# Patient Record
Sex: Male | Born: 1965 | Race: White | Hispanic: No | Marital: Married | Smoking: Never smoker
Health system: Southern US, Community
[De-identification: ages and names within clinical notes are randomized; demographics above are authoritative.]

## PROBLEM LIST (undated history)

## (undated) DIAGNOSIS — M1990 Unspecified osteoarthritis, unspecified site: Secondary | ICD-10-CM

## (undated) DIAGNOSIS — T4145XA Adverse effect of unspecified anesthetic, initial encounter: Secondary | ICD-10-CM

## (undated) DIAGNOSIS — Z9889 Other specified postprocedural states: Secondary | ICD-10-CM

## (undated) DIAGNOSIS — E7800 Pure hypercholesterolemia, unspecified: Secondary | ICD-10-CM

## (undated) DIAGNOSIS — T8859XA Other complications of anesthesia, initial encounter: Secondary | ICD-10-CM

## (undated) DIAGNOSIS — R112 Nausea with vomiting, unspecified: Secondary | ICD-10-CM

## (undated) HISTORY — DX: Pure hypercholesterolemia, unspecified: E78.00

---

## 1999-05-26 DIAGNOSIS — R112 Nausea with vomiting, unspecified: Secondary | ICD-10-CM

## 1999-05-26 HISTORY — PX: CERVICAL FUSION: SHX112

## 1999-05-26 HISTORY — DX: Nausea with vomiting, unspecified: R11.2

## 2000-09-02 ENCOUNTER — Encounter: Payer: Self-pay | Admitting: Neurosurgery

## 2000-09-02 ENCOUNTER — Inpatient Hospital Stay (HOSPITAL_COMMUNITY): Admission: RE | Admit: 2000-09-02 | Discharge: 2000-09-03 | Payer: Self-pay | Admitting: Neurosurgery

## 2000-09-29 ENCOUNTER — Encounter: Payer: Self-pay | Admitting: Neurosurgery

## 2000-09-29 ENCOUNTER — Encounter: Admission: RE | Admit: 2000-09-29 | Discharge: 2000-09-29 | Payer: Self-pay | Admitting: Neurosurgery

## 2005-08-19 LAB — PROTIME-INR

## 2005-09-25 LAB — PROTIME-INR

## 2008-02-09 LAB — PROTIME-INR

## 2008-10-26 ENCOUNTER — Ambulatory Visit: Payer: Self-pay | Admitting: Oncology

## 2008-11-21 LAB — CBC WITH DIFFERENTIAL/PLATELET
BASO%: 0.3 % (ref 0.0–2.0)
Basophils Absolute: 0 10*3/uL (ref 0.0–0.1)
EOS%: 1.3 % (ref 0.0–7.0)
Eosinophils Absolute: 0.1 10*3/uL (ref 0.0–0.5)
HCT: 43.1 % (ref 38.4–49.9)
HGB: 15.1 g/dL (ref 13.0–17.1)
LYMPH%: 27.3 % (ref 14.0–49.0)
MCH: 30.6 pg (ref 27.2–33.4)
MCHC: 35.2 g/dL (ref 32.0–36.0)
MCV: 87 fL (ref 79.3–98.0)
MONO#: 0.5 10*3/uL (ref 0.1–0.9)
MONO%: 8.3 % (ref 0.0–14.0)
NEUT#: 3.7 10*3/uL (ref 1.5–6.5)
NEUT%: 62.8 % (ref 39.0–75.0)
Platelets: 176 10*3/uL (ref 140–400)
RBC: 4.95 10*6/uL (ref 4.20–5.82)
RDW: 12.3 % (ref 11.0–14.6)
WBC: 5.9 10*3/uL (ref 4.0–10.3)
lymph#: 1.6 10*3/uL (ref 0.9–3.3)

## 2008-11-21 LAB — MORPHOLOGY
PLT EST: ADEQUATE
RBC Comments: NORMAL

## 2008-11-21 LAB — CHCC SMEAR

## 2008-11-21 LAB — PROTIME-INR

## 2008-11-21 LAB — D-DIMER, QUANTITATIVE: D-Dimer, Quant: <0.22 ug/mL-FEU (ref 0.00–0.48)

## 2010-10-10 NOTE — H&P (Signed)
[place]  Patient:    Jason Scott, Jason Scott                        MRN: [id number] Adm. Date:  09/02/00 Attending:  Izell [place]. Elesa Hacker, M.D.                         History and Physical  HISTORY OF PRESENT ILLNESS:  [name], Jason Scott. is a 45 year old male who presented with a chief complaint of pain in his neck associated with radiating pain towards his left scapular region and then occasionally on down his arm to and sometimes below the shoulder. He has difficulty with pain in these areas for several years off and on; but the last few months or so, he has noticed a significant worsening of his symptoms to the point that it is inferring with him every day, both at work and with his hobbies. He enjoys golf and fishing.  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 He has been having some difficulty with this because of weakness and pain in his arm.  He has tried ibuprofen, and this has helped to some extent. He has been seen by a chiropractor in [facility] but not recently.  He notes no involvement of bowel and bladder, and he says his legs are unaffected.  PAST MEDICAL HISTORY:  No serious medical problems.  FAMILY HISTORY:  His mother is living at age 39 but has sinus difficulty. His father is 63 and has high blood pressure and history of spurs in his neck that did require surgery.  PAST SURGICAL HISTORY:  He has had none.  CURRENT MEDICATIONS:  At the present time, he has been taking the ibuprofen as noted.  ALLERGIES:  He thinks he is allergic to PENICILLIN and CODEINE.  SOCIAL HISTORY:  He does not smoke. He drinks about two beers a day.  REVIEW OF SYSTEMS:  Pertinent as mentioned in the history of present illness.  PHYSICAL EXAMINATION:  GENERAL:  He is alert and cooperative.  VITAL SIGNS:  He weighs 174 pounds, is 6 feet 1 inch tall, with  a blood pressure of 126/86, and a pulse of 74; he is afebrile.  HEENT:  Within normal limits.  NECK:  Limited range of motion particularly when he turns to the left side. He has no adenopathy or mass lesions in his neck or supraclavicular region, and he has no bruits.  CHEST:  Clear.  CARDIAC:  No murmurs or enlargement.  ABDOMEN:  Soft and nontender with no palpable masses.  NEUROLOGICAL:  Sensorium and cranial nerves intact. His pain can be increased with rotation of the head and neck to the left particularly with hyperextension. He has good strength, except for reproducible triceps weakness on the left which is minimal but definite. Deep tendon reflexes show a diminished left biceps reflex on repeated testing. Gait and coordination are normal. Romberg test is negative. Sensory examination shows some slight amount of hypoesthesia over the thumb on the left.  LABORATORY DATA:  This man had his cervical MRI study done, and this demonstrated a disk herniation with spondylosis at C5, C6 on the left side.  PLAN:  He is admitted for an anterior cervical diskectomy and fusion at C5, C6.  Goals and risks of the operation were  discussed with him in detail. He knows that the risks include bleeding, infection, damage to structures in the neck including carotid artery, trachea, or esophagus. He understands that there can be damage to spinal cord and nerve root resulting in weakness, paralysis, and loss of sensation. DD:  09/01/00 TD:  09/01/00 Job: [id number]

## 2010-10-10 NOTE — Op Note (Signed)
[place]  Patient:    Jason Scott, Jason Scott                         MRN: [id number] Proc. Date: 09/02/00 Adm. Date:  [id number] Attending:  Jackelyn Knife                           Operative Report  PREOPERATIVE DIAGNOSIS:  Herniated cervical disk at C5-C6.  POSTOPERATIVE DIAGNOSIS:  Herniated cervical disk at C5-C6.  OPERATION PERFORMED:  C5-C6 anterior cervical diskectomy and fusion with allograft and application of a 26 mm Blackstone anterior cervical plate.  SURGEON:  Izell [employer]. Elesa Hacker, M.D.  ASSISTANT:  Clydene Fake, M.D.  ANESTHESIA:  INDICATIONS FOR PROCEDURE:  The patient is a generally healthy 45 year old male who presented with increasing neck and left upper extremity pain, in association with developing biceps weakness on the left side and numbness extending toward the thumb and index finger on that side.  An outside MRI demonstrated C5-C6 soft disk herniation.  Since he did not respond to conservative measures, he was admitted for the procedure described below.  DESCRIPTION OF PROCEDURE:  Under general endotracheal anesthesia, the patient was positioned on the operating table supine with his head in halter traction. The anterior portion of the head and neck was prepped and the patient draped in the usual manner.  A preliminary cross table lateral film was made with a metallic marker in place and the incision was then made based upon that localizing x-ray.  The incision was on the left side of the neck extending from the midline to the anterior border of the sternocleidomastoid. Dissection was carried down through subcutaneous tissues and platysmal layers to the strap muscles.  Blunt dissection was utilized to approach the anterior vertebral border.  A second cross table lateral film was taken to assure the proper operative level.  The C5-C6 interspace was identified and self-retaining retractors were put into place after the  longus colli muscles had been elevated from the anterior vertebral border.  The annulus was incised.  Caspar retractor posts were put into place and utilized for retraction interspace.  The operating microscope was brought into place at this point.  Using curets and pituitary rongeurs, the soft disk material in the interspace was removed. After this, a high speed drill was brought in for completion of removal of osteophytes and preparation of the end plate areas for receiving the bone graft.  Once all soft disk material had been removed, the posterior longitudinal ligament was sectioned in its entirety.  An 8 mm iliac crest allograft was shaped to fit in the interspace and impacted into place.  Halter traction was removed and Caspar retraction, likewise, was taken off.  Care was taken to ensure the fit of the bone graft.  The wound was copiously irrigated and hemostasis was obtained.  A 26 mm Blackstone system, anterior cervical plate was then put in place with four 14 mm screws.  An additional cross table lateral was taken intraoperatively to ensure proper positioning of the hardware and bone graft.  The wound was then further irrigated and closed in layers.  A sterile dressing was applied and the patient was taken to the recovery room in good condition. DD:  09/02/00 TD:  09/02/00 Job: [id number]

## 2013-04-13 ENCOUNTER — Telehealth: Payer: Self-pay | Admitting: Internal Medicine

## 2013-04-13 NOTE — Telephone Encounter (Signed)
lvom for pt to return call in re to referral.  °

## 2013-04-14 ENCOUNTER — Telehealth: Payer: Self-pay | Admitting: Internal Medicine

## 2013-04-14 NOTE — Telephone Encounter (Signed)
S/w pt and gve np appt 12/11 @ 10:30 w/Dr. Rosie Fate Referring Dr. Lois Huxley Dx- Coagulopathy Welcome packet mailed.

## 2013-04-14 NOTE — Telephone Encounter (Signed)
C/D 04/14/13 for appt. 05/04/13

## 2013-05-04 ENCOUNTER — Ambulatory Visit: Payer: BC Managed Care – PPO

## 2013-05-04 ENCOUNTER — Encounter: Payer: Self-pay | Admitting: Internal Medicine

## 2013-05-04 ENCOUNTER — Telehealth: Payer: Self-pay | Admitting: Internal Medicine

## 2013-05-04 ENCOUNTER — Other Ambulatory Visit: Payer: Commercial Managed Care - PPO

## 2013-05-04 ENCOUNTER — Ambulatory Visit (HOSPITAL_BASED_OUTPATIENT_CLINIC_OR_DEPARTMENT_OTHER): Payer: BC Managed Care – PPO | Admitting: Internal Medicine

## 2013-05-04 ENCOUNTER — Telehealth: Payer: Self-pay | Admitting: *Deleted

## 2013-05-04 ENCOUNTER — Ambulatory Visit (HOSPITAL_COMMUNITY)
Admission: RE | Admit: 2013-05-04 | Discharge: 2013-05-04 | Disposition: A | Discharge status: Home / Self Care | Payer: BC Managed Care – PPO | Source: Ambulatory Visit | Attending: Internal Medicine | Admitting: Internal Medicine

## 2013-05-04 ENCOUNTER — Ambulatory Visit (HOSPITAL_BASED_OUTPATIENT_CLINIC_OR_DEPARTMENT_OTHER): Payer: BC Managed Care – PPO

## 2013-05-04 VITALS — BP 142/82 | HR 68 | Temp 97.8°F | Resp 18 | Ht 73.0 in | Wt 210.2 lb

## 2013-05-04 DIAGNOSIS — D6859 Other primary thrombophilia: Secondary | ICD-10-CM

## 2013-05-04 DIAGNOSIS — E7800 Pure hypercholesterolemia, unspecified: Secondary | ICD-10-CM

## 2013-05-04 DIAGNOSIS — M7989 Other specified soft tissue disorders: Secondary | ICD-10-CM

## 2013-05-04 DIAGNOSIS — D6852 Prothrombin gene mutation: Secondary | ICD-10-CM

## 2013-05-04 DIAGNOSIS — M79609 Pain in unspecified limb: Secondary | ICD-10-CM | POA: Insufficient documentation

## 2013-05-04 NOTE — Progress Notes (Signed)
*  Preliminary Results* Bilateral lower extremity venous duplex completed. Bilateral lower extremities are negative for deep vein thrombosis. There is no evidence of Baker's cyst bilaterally.  Attempted to call preliminary results to [phone], however there was no answer. The patient will be discharged and can be reached by phone if necessary.  05/04/2013  Gertie Fey, RVT, RDCS, RDMS

## 2013-05-04 NOTE — Progress Notes (Signed)
Checked in new patient and has no financial issues and has not been to [place]. He has appt card. He is JR. So make sure all says JR--or will get mixed up with his dad

## 2013-05-04 NOTE — Telephone Encounter (Signed)
gv and printed appt sched and avs for pt for JUNE sent pt to lab....  and dopper added

## 2013-05-04 NOTE — Telephone Encounter (Signed)
Called pt at work and left message re:  Doppler stuly results -  Negative for DVT.

## 2013-05-05 LAB — HYPERCOAGULABLE PANEL, COMPREHENSIVE
AntiThromb III Func: 115 % (ref 76–126)
Anticardiolipin IgG: 16 GPL U/mL (ref <23)
Beta-2 Glyco I IgG: 2 G Units (ref <20)
DRVVT: 34.9 secs (ref <42.9)
Lupus Anticoagulant: NOT DETECTED
Protein C Activity: 200 % — ABNORMAL HIGH (ref 75–133)
Protein S Activity: 137 % — ABNORMAL HIGH (ref 69–129)
Protein S Total: 126 % (ref 60–150)

## 2013-05-06 ENCOUNTER — Encounter: Payer: Self-pay | Admitting: Internal Medicine

## 2013-05-06 DIAGNOSIS — E7800 Pure hypercholesterolemia, unspecified: Secondary | ICD-10-CM | POA: Insufficient documentation

## 2013-05-06 DIAGNOSIS — D6852 Prothrombin gene mutation: Secondary | ICD-10-CM | POA: Insufficient documentation

## 2013-05-06 NOTE — Progress Notes (Signed)
[facility] Telephone:([phone]) [phone]   Fax:([phone]) [phone]  NEW PATIENT EVALUATION   Name: Jason Scott Date: 05/06/2013 MRN: [id number] DOB: 02-Jan-1966  PCP: Carmin Richmond, MD   REFERRING PHYSICIAN: Carmin Richmond, MD  REASON FOR REFERRAL: H.o. Hypercoagulable state    HISTORY OF PRESENT ILLNESS:Jason Scott is a 47 y.o. male with a history of hypercholesteremia who also reports a history of primary hypercoagulable state is here for evaluation and management.  He was referred by Dr. Lois Huxley who saw him on 04/12/2013 for a general physical exam.   At that time his labs showed an elevated protein C and S, no factor V Leiden mutation and previous records showed a normal anti-thrombin III.  Patient reports that he has never having a deep venous thrombosis history.  He does endorse having several relatives with a history of blood clots including his father.  He denies a family history of massive clots.   He has traveled long distances in the past without clots.  He is compliant with a daily baby aspirin.  Recently, he does endorse pain with ambulation near his right knee with mild swelling.  He denies any recent falls or trauma.  His complete blood count one month ago revealed a normal wBC of 7.1, hemoglobin of 15.7 and normal plt of 238.  His creatinine was 0.75.  His LDL was 198 (high).  He had normal LFTs.  His Protein S activity was 156.   His factor V leiden was negative.  His protein C activity was 188.    He does report a remote history of being on coumadin for a primary hypercoagulable stable until 2009.  He was seen previously by our group and instructed he did not need to take coumadin prophylactically. He has since been off coumadin without any clots.   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    PAST SURGICAL HISTORY: Past Surgical History  Procedure Laterality Date  . Cervical fusion       CURRENT MEDICATIONS: has a current medication list which includes the following prescription(s): aspirin.   ALLERGIES: Penicillins   SOCIAL HISTORY:  reports that he has never smoked. He does not have any smokeless tobacco history on file. Self-employed as a lawn-grooming business.    FAMILY HISTORY: family history includes Clotting disorder in his father, paternal aunt, and paternal uncle.   LABORATORY DATA:    Results for QUEST, TAVENNER (MRN [id number]) as of 05/06/2013 15:48  Ref. Range 05/04/2013 12:22  Anticardiolipin IgA Latest Range: <22 APL U/mL 7  Anticardiolipin IgG Latest Range: <23 GPL U/mL 16  Anticardiolipin IgM Latest Range: <11 MPL U/mL 14 (H)  PTT Lupus Anticoagulant Latest Range: 28.0-43.0 secs 31.2  PTTLA Confirmation Latest Range: <8.0 secs NOT APPL  PTTLA 4:1 Mix Latest Range: 28.0-43.0 secs NOT APPL  DRVVT Latest Range: <42.9 secs 34.9  Drvvt confirmation Latest Range: <1.11 Ratio NOT APPL  Lupus Anticoagulant Latest Range: NOT DETECTED  NOT DETECTED  Beta-2 Glyco I IgG Latest Range: <20 G Units 2  Beta-2-Glycoprotein I IgA Latest Range: <20 A Units 5  Beta-2-Glycoprotein I IgM Latest Range: <20 M Units 8  AntiThromb III Func Latest Range: 76-126 % 115  Recommendations-F5LEID: No range found *  Recommendations-PTGENE: No range found *  DRVVT 1:1 Mix Latest Range: <42.9 secs NOT APPL  Protein C Activity Latest Range: 75-133 % 200 (H)  Protein C, Total  Latest Range: 72-160 % 133  Protein S Activity Latest Range: 69-129 % 137 (H)    **HETEROZYGOUS FOR PROTHROMBIN II GENE MUTATION ** Reference Interval:Negative for Factor V Mutation Interpretation:Factor V Leiden Gene Mutation (G1691A) is the most common genetic riskfactor for thrombosis and accounts for 94% of individuals classifiedas Activated Protein C (APC) resistant. Testing for Factor V LeidenMutation is recommended for all individuals with venous thrombosis orhistory of a thrombic event. Both  heterozygotes and homozygotes are at risk for thrombosis, whichis 5-10 fold, and 50-100 fold respectively. DNA testing for the V R2 polymorphism is recommended for Factor VLeiden heterozygotes. Presence of this polymorphism further increasesthe risk of venous thrombosis in Factor V Leiden heterozygotes. DNA from this patient was tested using a FDA approved assay for Factor V Leiden.   Reference Interval:Negative for Prothrombin II Gene Mutation Interpretation:Prothrombin II 20210A Gene Mutation is a genetic risk factor resultingin an increase risk for venous thrombosis (five-fold); Myocardialinfarction (two-fold); Cerebrovascular ischemic disease in young patients (especially females using oral contraceptives) and pulmonary embolism. Both heterozygous and homozygous carriers are at increased risk although homozygosity is rare. The incidence of heterozygous carriersof this mutation is estimated to be 1 to 2.5% for individuals ofEuropean and African descent. DNA from this patient was tested using a FDA approved assay for Factor II Prothrombin Mutation.   RADIOGRAPHY: Bilateral Lower Extremity Venous Duplex Evaluation Patient: Jason Scott, Jason Scott MR #: [id number] Study Date: 05/04/2013  Gender: M Age: 51 Height: Weight: BSA:  Pt. Status: Room: Jeris Penta SONOGRAPHER Gertie Fey, RVT, RDCS, RDMS ATTENDING Zubin Pontillo, Dia Crawford Clotilda Hafer, Nelda Bucks Aldene Hendon, Onalee Hua  Reports also to: ------------------------------------------------------------ History and indications:  Indications  729.5 Pain in limb. History  Diagnostic evaluation.  ------------------------------------------------------------ Study information:  Study status: Routine. Procedure: A vascular evaluation was performed with the patient in the supine position. The right common femoral, right femoral, right profunda femoral, right popliteal, right peroneal, right posterior tibial, left common femoral, left femoral, left  profunda femoral, left popliteal, left peroneal, and left posterior tibial veins were studied. Image quality was adequate. Bilateral lower extremity venous duplex evaluation. Doppler flow study including B-mode compression maneuvers of all visualized segments, color flow Doppler and selected views of pulsed wave Doppler. Location: Vascular laboratory. Patient status: Outpatient.  Summary: - No evidence of deep vein thrombosis involving the right lower extremity and left lower extremity. - No evidence of Baker's cyst on the right or left. Other specific details can be found in the table(s) above. Prepared and Electronically Authenticated by  Cari Caraway 2014-12-11T17:40:22.583   REVIEW OF SYSTEMS:  Constitutional: Denies fevers, chills or abnormal weight loss Eyes: Denies blurriness of vision Ears, nose, mouth, throat, and face: Denies mucositis or sore throat Respiratory: Denies cough, dyspnea or wheezes Cardiovascular: Denies palpitation, chest discomfort but right leg mild swelling and discomfort for the past few weeks.  Gastrointestinal:  Denies nausea, heartburn or change in bowel habits Skin: Denies abnormal skin rashes Lymphatics: Denies new lymphadenopathy or easy bruising Neurological:Denies numbness, tingling or new weaknesses Behavioral/Psych: Mood is stable, no new changes  All other systems were reviewed with the patient and are negative.  PHYSICAL EXAM:  height is 6\' 1"  (1.854 m) and weight is 210 lb 3.2 oz (95.346 kg). His oral temperature is 97.8 F (36.6 C). His blood pressure is 142/82 and his pulse is 68. His respiration is 18 and oxygen saturation is 97%.    GENERAL:alert, no distress and comfortable; well developed and well nourished.  SKIN:  skin color, texture, turgor are normal, no rashes or significant lesions EYES: normal, Conjunctiva are pink and non-injected, sclera clear OROPHARYNX:no exudate, no erythema and lips, buccal mucosa, and tongue normal    NECK: supple, thyroid normal size, non-tender, without nodularity LYMPH:  no palpable lymphadenopathy in the cervical, axillary or inguinal LUNGS: clear to auscultation and percussion with normal breathing effort HEART: regular rate & rhythm and no murmurs and R lower extremity edema mildly greater than L.  TTP near the R Knee.   ABDOMEN:abdomen soft, non-tender and normal bowel sounds Musculoskeletal:no cyanosis of digits and no clubbing  NEURO: alert & oriented x 3 with fluent speech, no focal motor/sensory deficits   IMPRESSION: Jason Scott is a 47 y.o. male with a history of hypercoagulable state, now with mild R LE pain.   PLAN: 1.  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 --Heterozygous carriers of the G20210A prothrombin gene mutation have an increased risk of deep vein and cerebral vein thrombosis. The incidence of heterozygous carriers of this mutation is estimated to be 1 to 2.5% for individuals ofEuropean and African descent. --Our general approach to the management of those with laboratory-only evidence of thrombophilia is to treat them as we do the general population. We do not recommend anticoagulation.  We did however counseled him to report any symptoms of VTE and consider prophylaxis with high risk procedures, i.e., major surgery.   2. R.L.E. Swelling --Above venous duplex is negative for DVT.   3. Hypercholesteremia in the setting of #1. --We agree with continued aggressive management in the setting of his increased risk of CVA due to being a heterozygous carrier of prothrombin gene mutation.  --Continue statin and aspirin.   4. Follow-up.  --We will follow up with him in six month and if no complaints we will see him prn.   All questions were answered. The patient knows to call the clinic with any problems, questions or concerns. We can certainly see the patient much sooner if necessary.  I spent 25 minutes counseling the patient face to face. The total time  spent in the appointment was 45 minutes.    Avagail Whittlesey, MD 05/06/2013 4:18 PM

## 2013-11-02 ENCOUNTER — Ambulatory Visit: Payer: Commercial Managed Care - PPO

## 2013-11-16 ENCOUNTER — Ambulatory Visit: Payer: Commercial Managed Care - PPO

## 2018-04-05 ENCOUNTER — Other Ambulatory Visit: Payer: Self-pay | Admitting: Orthopedic Surgery

## 2018-04-05 DIAGNOSIS — M25511 Pain in right shoulder: Secondary | ICD-10-CM

## 2018-04-27 ENCOUNTER — Ambulatory Visit
Admission: RE | Admit: 2018-04-27 | Discharge: 2018-04-27 | Disposition: A | Discharge status: Home / Self Care | Payer: BLUE CROSS/BLUE SHIELD | Source: Ambulatory Visit | Attending: Orthopedic Surgery | Admitting: Orthopedic Surgery

## 2018-04-27 DIAGNOSIS — M19011 Primary osteoarthritis, right shoulder: Secondary | ICD-10-CM | POA: Diagnosis not present

## 2018-04-27 DIAGNOSIS — M75121 Complete rotator cuff tear or rupture of right shoulder, not specified as traumatic: Secondary | ICD-10-CM | POA: Insufficient documentation

## 2018-04-27 DIAGNOSIS — M25511 Pain in right shoulder: Secondary | ICD-10-CM

## 2018-04-27 MED ORDER — LIDOCAINE HCL (PF) 1 % IJ SOLN
5.0000 mL | Freq: Once | INTRAMUSCULAR | Status: AC
  Administered 2018-04-27: 5 mL
  Filled 2018-04-27: qty 5

## 2018-04-27 MED ORDER — GADOBUTROL 1 MMOL/ML IV SOLN
0.0500 mL | Freq: Once | INTRAVENOUS | Status: AC | PRN
  Administered 2018-04-27: 0.05 mL

## 2018-04-27 MED ORDER — SODIUM CHLORIDE (PF) 0.9 % IJ SOLN
10.0000 mL | Freq: Once | INTRAMUSCULAR | Status: AC
  Administered 2018-04-27: 10 mL via INTRAVENOUS

## 2018-04-27 MED ORDER — IOPAMIDOL (ISOVUE-300) INJECTION 61%
50.0000 mL | Freq: Once | INTRAVENOUS | Status: AC | PRN
  Administered 2018-04-27: 20 mL via INTRAVENOUS

## 2018-04-28 ENCOUNTER — Other Ambulatory Visit: Payer: Self-pay | Admitting: Orthopedic Surgery

## 2018-05-04 ENCOUNTER — Encounter
Admission: RE | Admit: 2018-05-04 | Discharge: 2018-05-04 | Disposition: A | Discharge status: Home / Self Care | Payer: BLUE CROSS/BLUE SHIELD | Source: Ambulatory Visit | Attending: Orthopedic Surgery | Admitting: Orthopedic Surgery

## 2018-05-04 ENCOUNTER — Other Ambulatory Visit: Payer: Self-pay

## 2018-05-04 HISTORY — DX: Unspecified osteoarthritis, unspecified site: M19.90

## 2018-05-04 HISTORY — DX: Nausea with vomiting, unspecified: R11.2

## 2018-05-04 HISTORY — DX: Other complications of anesthesia, initial encounter: T88.59XA

## 2018-05-04 HISTORY — DX: Other specified postprocedural states: Z98.890

## 2018-05-04 HISTORY — DX: Adverse effect of unspecified anesthetic, initial encounter: T41.45XA

## 2018-05-04 NOTE — Patient Instructions (Signed)
Your procedure is scheduled on: 05-05-18 Report to Same Day Surgery 2nd floor medical mall [facility](Medical Mall Entrance-take elevator on left to 2nd floor.  Check in with surgery information desk.) To find out your arrival time please call [phone] between 1PM - 3PM on 05-04-18  Remember: Instructions that are not followed completely may result in serious medical risk, up to and including death, or upon the discretion of your surgeon and anesthesiologist your surgery may need to be rescheduled.    _x___ 1. Do not eat food after midnight the night before your procedure. You may drink clear liquids up to 2 hours before you are scheduled to arrive at the hospital for your procedure.  Do not drink clear liquids within 2 hours of your scheduled arrival to the hospital.  Clear liquids include  --Water or Apple juice without pulp  --Clear carbohydrate beverage such as ClearFast or Gatorade  --Black Coffee or Clear Tea (No milk, no creamers, do not add anything to the coffee or Tea   ____Ensure clear carbohydrate drink on the way to the hospital for bariatric patients  ____Ensure clear carbohydrate drink 3 hours before surgery for Dr Rutherford NailByrnett's patients if physician instructed.   No gum chewing or hard candies.     __x__ 2. No Alcohol for 24 hours before or after surgery.   __x__3. No Smoking or e-cigarettes for 24 prior to surgery.  Do not use any chewable tobacco products for at least 6 hour prior to surgery   ____  4. Bring all medications with you on the day of surgery if instructed.    __x__ 5. Notify your doctor if there is any change in your medical condition     (cold, fever, infections).    x___6. On the morning of surgery brush your teeth with toothpaste and water.  You may rinse your mouth with mouth wash if you wish.  Do not swallow any toothpaste or mouthwash.   Do not wear jewelry, make-up, hairpins, clips or nail polish.  Do not wear lotions, powders, or perfumes. You may wear  deodorant.  Do not shave 48 hours prior to surgery. Men may shave face and neck.  Do not bring valuables to the hospital.    [facility] any belongings or valuables.               Contacts, dentures or bridgework may not be worn into surgery.  Leave your suitcase in the car. After surgery it may be brought to your room.  For patients admitted to the hospital, discharge time is determined by your  treatment team.  _  Patients discharged the day of surgery will not be allowed to drive home.  You will need someone to drive you home and stay with you the night of your procedure.    Please read over the following fact sheets that you were given:   [facility] Pre[facility]   _x___ TAKE THE FOLLOWING MEDICATION THE MORNING OF SURGERY. These include:  1. LIPITOR  2.  3.  4.  5.  6.  ____Fleets enema or Magnesium Citrate as directed.   ____ Use CHG Soap or sage wipes as directed on instruction sheet   ____ Use inhalers on the day of surgery and bring to hospital day of surgery  ____ Stop Metformin and Janumet 2 days prior to surgery.    ____ Take 1/2 of usual insulin dose the night before surgery and none on the morning  surgery.   _x___ Follow recommendations from Cardiologist, Pulmonologist or PCP regarding stopping Aspirin, Coumadin, Plavix ,Eliquis, Effient, or Pradaxa, and Pletal-PT STOPPED HIS 81 MG ASA LAST WEEK  X____Stop Anti-inflammatories such as Advil, Aleve, Ibuprofen, Motrin, Naproxen, Naprosyn, Goodies powders or aspirin products NOW-OK to take Tylenol   ____ Stop supplements until after surgery.    ____ Bring C-Pap to the hospital.

## 2018-05-05 ENCOUNTER — Encounter: Payer: Self-pay | Admitting: Emergency Medicine

## 2018-05-05 ENCOUNTER — Ambulatory Visit: Payer: BLUE CROSS/BLUE SHIELD | Admitting: Anesthesiology

## 2018-05-05 ENCOUNTER — Encounter
Admission: RE | Disposition: A | Discharge status: Home / Self Care | Payer: Self-pay | Source: Home / Self Care | Attending: Orthopedic Surgery

## 2018-05-05 ENCOUNTER — Ambulatory Visit
Admission: RE | Admit: 2018-05-05 | Discharge: 2018-05-05 | Disposition: A | Discharge status: Home / Self Care | Payer: BLUE CROSS/BLUE SHIELD | Attending: Orthopedic Surgery | Admitting: Orthopedic Surgery

## 2018-05-05 DIAGNOSIS — E7800 Pure hypercholesterolemia, unspecified: Secondary | ICD-10-CM | POA: Diagnosis not present

## 2018-05-05 DIAGNOSIS — M7541 Impingement syndrome of right shoulder: Secondary | ICD-10-CM | POA: Diagnosis not present

## 2018-05-05 DIAGNOSIS — M75101 Unspecified rotator cuff tear or rupture of right shoulder, not specified as traumatic: Secondary | ICD-10-CM | POA: Insufficient documentation

## 2018-05-05 DIAGNOSIS — M719 Bursopathy, unspecified: Secondary | ICD-10-CM | POA: Insufficient documentation

## 2018-05-05 DIAGNOSIS — X58XXXA Exposure to other specified factors, initial encounter: Secondary | ICD-10-CM | POA: Insufficient documentation

## 2018-05-05 DIAGNOSIS — Z7982 Long term (current) use of aspirin: Secondary | ICD-10-CM | POA: Insufficient documentation

## 2018-05-05 DIAGNOSIS — M19011 Primary osteoarthritis, right shoulder: Secondary | ICD-10-CM | POA: Insufficient documentation

## 2018-05-05 DIAGNOSIS — S46111A Strain of muscle, fascia and tendon of long head of biceps, right arm, initial encounter: Secondary | ICD-10-CM | POA: Insufficient documentation

## 2018-05-05 HISTORY — PX: SHOULDER ARTHROSCOPY WITH OPEN ROTATOR CUFF REPAIR: SHX6092

## 2018-05-05 LAB — CBC WITH DIFFERENTIAL/PLATELET
Abs Immature Granulocytes: 0.02 10*3/uL (ref 0.00–0.07)
Basophils Absolute: 0 10*3/uL (ref 0.0–0.1)
Basophils Relative: 1 %
EOS ABS: 0.2 10*3/uL (ref 0.0–0.5)
Eosinophils Relative: 3 %
HCT: 41.9 % (ref 39.0–52.0)
Hemoglobin: 14.5 g/dL (ref 13.0–17.0)
Immature Granulocytes: 0 %
LYMPHS PCT: 26 %
Lymphs Abs: 1.6 10*3/uL (ref 0.7–4.0)
MCH: 30.2 pg (ref 26.0–34.0)
MCHC: 34.6 g/dL (ref 30.0–36.0)
MCV: 87.3 fL (ref 80.0–100.0)
MONO ABS: 0.7 10*3/uL (ref 0.1–1.0)
Monocytes Relative: 11 %
NEUTROS PCT: 59 %
Neutro Abs: 3.6 10*3/uL (ref 1.7–7.7)
Platelets: 218 10*3/uL (ref 150–400)
RBC: 4.8 MIL/uL (ref 4.22–5.81)
RDW: 11.8 % (ref 11.5–15.5)
WBC: 6 10*3/uL (ref 4.0–10.5)
nRBC: 0 % (ref 0.0–0.2)

## 2018-05-05 LAB — PROTIME-INR
INR: 1.03
Prothrombin Time: 13.4 seconds (ref 11.4–15.2)

## 2018-05-05 LAB — BASIC METABOLIC PANEL
Anion gap: 6 (ref 5–15)
BUN: 14 mg/dL (ref 6–20)
CO2: 27 mmol/L (ref 22–32)
CREATININE: 0.72 mg/dL (ref 0.61–1.24)
Calcium: 9.3 mg/dL (ref 8.9–10.3)
Chloride: 106 mmol/L (ref 98–111)
GFR calc Af Amer: >60 mL/min (ref >60)
GFR calc non Af Amer: >60 mL/min (ref >60)
Glucose, Bld: 122 mg/dL — ABNORMAL HIGH (ref 70–99)
Potassium: 4.1 mmol/L (ref 3.5–5.1)
Sodium: 139 mmol/L (ref 135–145)

## 2018-05-05 LAB — APTT: aPTT: 30 seconds (ref 24–36)

## 2018-05-05 SURGERY — ARTHROSCOPY, SHOULDER WITH REPAIR, ROTATOR CUFF, OPEN
Anesthesia: General | Site: Shoulder | Laterality: Right

## 2018-05-05 MED ORDER — BUPIVACAINE LIPOSOME 1.3 % IJ SUSP
INTRAMUSCULAR | Status: DC | PRN
  Administered 2018-05-05: 13 mL via PERINEURAL
  Administered 2018-05-05: 7 mL via PERINEURAL

## 2018-05-05 MED ORDER — OXYCODONE HCL 5 MG/5ML PO SOLN: 5.0000 mg | Freq: Once | ORAL | Status: DC | PRN

## 2018-05-05 MED ORDER — NEOMYCIN-POLYMYXIN B GU 40-200000 IR SOLN
Status: DC | PRN
  Administered 2018-05-05: 2 mL

## 2018-05-05 MED ORDER — LIDOCAINE HCL (PF) 2 % IJ SOLN
INTRAMUSCULAR | Status: AC
  Filled 2018-05-05: qty 10

## 2018-05-05 MED ORDER — SODIUM CHLORIDE 0.9 % IV SOLN
INTRAVENOUS | Status: DC | PRN
  Administered 2018-05-05: 25 ug/min via INTRAVENOUS

## 2018-05-05 MED ORDER — CHLORHEXIDINE GLUCONATE CLOTH 2 % EX PADS: 6.0000 | MEDICATED_PAD | Freq: Once | CUTANEOUS | Status: DC

## 2018-05-05 MED ORDER — LIDOCAINE HCL (PF) 1 % IJ SOLN
INTRAMUSCULAR | Status: AC
  Filled 2018-05-05: qty 5

## 2018-05-05 MED ORDER — PROPOFOL 500 MG/50ML IV EMUL
INTRAVENOUS | Status: DC | PRN
  Administered 2018-05-05: 15 ug/kg/min via INTRAVENOUS

## 2018-05-05 MED ORDER — OXYCODONE HCL 5 MG PO TABS: 5.0000 mg | ORAL_TABLET | ORAL | 0 refills | Status: DC | PRN

## 2018-05-05 MED ORDER — ROCURONIUM BROMIDE 50 MG/5ML IV SOLN
INTRAVENOUS | Status: AC
  Filled 2018-05-05: qty 1

## 2018-05-05 MED ORDER — NEOMYCIN-POLYMYXIN B GU 40-200000 IR SOLN
Status: AC
  Filled 2018-05-05: qty 2

## 2018-05-05 MED ORDER — FENTANYL CITRATE (PF) 100 MCG/2ML IJ SOLN: 25.0000 ug | INTRAMUSCULAR | Status: DC | PRN

## 2018-05-05 MED ORDER — BUPIVACAINE HCL (PF) 0.5 % IJ SOLN
INTRAMUSCULAR | Status: DC | PRN
  Administered 2018-05-05: 7 mL
  Administered 2018-05-05: 3 mL via PERINEURAL

## 2018-05-05 MED ORDER — CLINDAMYCIN PHOSPHATE 600 MG/50ML IV SOLN
INTRAVENOUS | Status: AC
  Filled 2018-05-05: qty 50

## 2018-05-05 MED ORDER — LACTATED RINGERS IV SOLN
INTRAVENOUS | Status: DC
  Administered 2018-05-05 (×2): via INTRAVENOUS

## 2018-05-05 MED ORDER — SUCCINYLCHOLINE CHLORIDE 20 MG/ML IJ SOLN
INTRAMUSCULAR | Status: DC | PRN
  Administered 2018-05-05: 100 mg via INTRAVENOUS

## 2018-05-05 MED ORDER — BUPIVACAINE HCL (PF) 0.5 % IJ SOLN
INTRAMUSCULAR | Status: AC
  Filled 2018-05-05: qty 10

## 2018-05-05 MED ORDER — FAMOTIDINE 20 MG PO TABS
20.0000 mg | ORAL_TABLET | Freq: Once | ORAL | Status: AC
  Administered 2018-05-05: 20 mg via ORAL

## 2018-05-05 MED ORDER — OXYCODONE HCL 5 MG PO TABS: 5.0000 mg | ORAL_TABLET | Freq: Once | ORAL | Status: DC | PRN

## 2018-05-05 MED ORDER — SUCCINYLCHOLINE CHLORIDE 20 MG/ML IJ SOLN
INTRAMUSCULAR | Status: AC
  Filled 2018-05-05: qty 1

## 2018-05-05 MED ORDER — ACETAMINOPHEN 10 MG/ML IV SOLN
INTRAVENOUS | Status: DC | PRN
  Administered 2018-05-05: 1000 mg via INTRAVENOUS

## 2018-05-05 MED ORDER — SUGAMMADEX SODIUM 200 MG/2ML IV SOLN
INTRAVENOUS | Status: DC | PRN
  Administered 2018-05-05: 200 mg via INTRAVENOUS

## 2018-05-05 MED ORDER — CLINDAMYCIN PHOSPHATE 600 MG/50ML IV SOLN
600.0000 mg | INTRAVENOUS | Status: AC
  Administered 2018-05-05: 600 mg via INTRAVENOUS

## 2018-05-05 MED ORDER — LIDOCAINE HCL (CARDIAC) PF 100 MG/5ML IV SOSY
PREFILLED_SYRINGE | INTRAVENOUS | Status: DC | PRN
  Administered 2018-05-05: 100 mg via INTRAVENOUS

## 2018-05-05 MED ORDER — FAMOTIDINE 20 MG PO TABS
ORAL_TABLET | ORAL | Status: AC
  Administered 2018-05-05: 20 mg via ORAL
  Filled 2018-05-05: qty 1

## 2018-05-05 MED ORDER — FENTANYL CITRATE (PF) 100 MCG/2ML IJ SOLN
50.0000 ug | Freq: Once | INTRAMUSCULAR | Status: AC
  Administered 2018-05-05: 50 ug via INTRAVENOUS

## 2018-05-05 MED ORDER — ONDANSETRON HCL 4 MG/2ML IJ SOLN
INTRAMUSCULAR | Status: AC
  Filled 2018-05-05: qty 2

## 2018-05-05 MED ORDER — FENTANYL CITRATE (PF) 100 MCG/2ML IJ SOLN
INTRAMUSCULAR | Status: AC
  Administered 2018-05-05: 50 ug via INTRAVENOUS
  Filled 2018-05-05: qty 2

## 2018-05-05 MED ORDER — MIDAZOLAM HCL 2 MG/2ML IJ SOLN
1.0000 mg | Freq: Once | INTRAMUSCULAR | Status: AC
  Administered 2018-05-05: 1 mg via INTRAVENOUS

## 2018-05-05 MED ORDER — MIDAZOLAM HCL 2 MG/2ML IJ SOLN
INTRAMUSCULAR | Status: AC
  Administered 2018-05-05: 1 mg via INTRAVENOUS
  Filled 2018-05-05: qty 2

## 2018-05-05 MED ORDER — DEXAMETHASONE SODIUM PHOSPHATE 10 MG/ML IJ SOLN
INTRAMUSCULAR | Status: DC | PRN
  Administered 2018-05-05: 10 mg via INTRAVENOUS

## 2018-05-05 MED ORDER — FENTANYL CITRATE (PF) 100 MCG/2ML IJ SOLN
INTRAMUSCULAR | Status: DC | PRN
  Administered 2018-05-05 (×2): 25 ug via INTRAVENOUS

## 2018-05-05 MED ORDER — EPINEPHRINE 30 MG/30ML IJ SOLN
INTRAMUSCULAR | Status: AC
  Filled 2018-05-05: qty 1

## 2018-05-05 MED ORDER — BUPIVACAINE HCL (PF) 0.25 % IJ SOLN
INTRAMUSCULAR | Status: DC | PRN
  Administered 2018-05-05: 30 mL

## 2018-05-05 MED ORDER — ACETAMINOPHEN 10 MG/ML IV SOLN
INTRAVENOUS | Status: AC
  Filled 2018-05-05: qty 100

## 2018-05-05 MED ORDER — SEVOFLURANE IN SOLN
RESPIRATORY_TRACT | Status: AC
  Filled 2018-05-05: qty 250

## 2018-05-05 MED ORDER — PROPOFOL 10 MG/ML IV BOLUS
INTRAVENOUS | Status: DC | PRN
  Administered 2018-05-05: 150 mg via INTRAVENOUS

## 2018-05-05 MED ORDER — ONDANSETRON HCL 4 MG/2ML IJ SOLN
INTRAMUSCULAR | Status: DC | PRN
  Administered 2018-05-05: 4 mg via INTRAVENOUS

## 2018-05-05 MED ORDER — BUPIVACAINE LIPOSOME 1.3 % IJ SUSP
INTRAMUSCULAR | Status: AC
  Filled 2018-05-05: qty 20

## 2018-05-05 MED ORDER — PHENYLEPHRINE HCL 10 MG/ML IJ SOLN
INTRAMUSCULAR | Status: DC | PRN
  Administered 2018-05-05: 200 ug via INTRAVENOUS

## 2018-05-05 MED ORDER — MIDAZOLAM HCL 2 MG/2ML IJ SOLN
INTRAMUSCULAR | Status: AC
  Filled 2018-05-05: qty 2

## 2018-05-05 MED ORDER — MIDAZOLAM HCL 2 MG/2ML IJ SOLN
INTRAMUSCULAR | Status: DC | PRN
  Administered 2018-05-05: 1 mg via INTRAVENOUS

## 2018-05-05 MED ORDER — DEXAMETHASONE SODIUM PHOSPHATE 10 MG/ML IJ SOLN
INTRAMUSCULAR | Status: AC
  Filled 2018-05-05: qty 1

## 2018-05-05 MED ORDER — PROMETHAZINE HCL 25 MG/ML IJ SOLN: 6.2500 mg | INTRAMUSCULAR | Status: DC | PRN

## 2018-05-05 MED ORDER — BUPIVACAINE HCL (PF) 0.25 % IJ SOLN
INTRAMUSCULAR | Status: AC
  Filled 2018-05-05: qty 30

## 2018-05-05 MED ORDER — ROCURONIUM BROMIDE 100 MG/10ML IV SOLN
INTRAVENOUS | Status: DC | PRN
  Administered 2018-05-05: 10 mg via INTRAVENOUS
  Administered 2018-05-05: 5 mg via INTRAVENOUS
  Administered 2018-05-05: 30 mg via INTRAVENOUS
  Administered 2018-05-05: 5 mg via INTRAVENOUS

## 2018-05-05 MED ORDER — FENTANYL CITRATE (PF) 100 MCG/2ML IJ SOLN
INTRAMUSCULAR | Status: AC
  Filled 2018-05-05: qty 2

## 2018-05-05 MED ORDER — EPINEPHRINE PF 1 MG/ML IJ SOLN
INTRAMUSCULAR | Status: DC | PRN
  Administered 2018-05-05: 22 mL

## 2018-05-05 MED ORDER — SUGAMMADEX SODIUM 200 MG/2ML IV SOLN
INTRAVENOUS | Status: AC
  Filled 2018-05-05: qty 2

## 2018-05-05 MED ORDER — PROPOFOL 500 MG/50ML IV EMUL
INTRAVENOUS | Status: AC
  Filled 2018-05-05: qty 50

## 2018-05-05 MED ORDER — ONDANSETRON HCL 4 MG PO TABS: 4.0000 mg | ORAL_TABLET | Freq: Three times a day (TID) | ORAL | 0 refills | Status: DC | PRN

## 2018-05-05 SURGICAL SUPPLY — 76 items
ADAPTER IRRIG TUBE 2 SPIKE SOL (ADAPTER) ×6 IMPLANT
ADPR TBG 2 SPK PMP STRL ASCP (ADAPTER) ×2
ANCH SUT 5.5 KNTLS PEEK (Orthopedic Implant) ×3 IMPLANT
ANCH SUT Q-FX 2.8 (Anchor) ×3 IMPLANT
ANCHOR ALL-SUT Q-FIX 2.8 (Anchor) ×6 IMPLANT
ANCHOR SUT 5.5 MULTIFIX (Orthopedic Implant) ×3 IMPLANT
ANCHOR SUT 5.5MM MULTIFIX (Orthopedic Implant) ×3 IMPLANT
BUR RADIUS 4.0X18.5 (BURR) ×3 IMPLANT
BUR RADIUS 5.5 (BURR) ×3 IMPLANT
CANNULA 5.75X7 CRYSTAL CLEAR (CANNULA) ×6 IMPLANT
CANNULA PARTIAL THREAD 2X7 (CANNULA) ×3 IMPLANT
CANNULA TWIST IN 8.25X9CM (CANNULA) IMPLANT
CLOSURE WOUND 1/2 X4 (GAUZE/BANDAGES/DRESSINGS) ×2
CONNECTOR PERFECT PASSER (CONNECTOR) ×2 IMPLANT
COOLER POLAR GLACIER W/PUMP (MISCELLANEOUS) ×3 IMPLANT
COVER WAND RF STERILE (DRAPES) ×3 IMPLANT
CRADLE LAMINECT ARM (MISCELLANEOUS) ×3 IMPLANT
DEVICE SUCT BLK HOLE OR FLOOR (MISCELLANEOUS) ×4 IMPLANT
DRAPE IMP U-DRAPE 54X76 (DRAPES) ×6 IMPLANT
DRAPE INCISE IOBAN 66X45 STRL (DRAPES) ×3 IMPLANT
DRAPE SHEET LG 3/4 BI-LAMINATE (DRAPES) ×3 IMPLANT
DRAPE U-SHAPE 47X51 STRL (DRAPES) IMPLANT
DURAPREP 26ML APPLICATOR (WOUND CARE) ×11 IMPLANT
ELECT REM PT RETURN 9FT ADLT (ELECTROSURGICAL) ×3
ELECTRODE REM PT RTRN 9FT ADLT (ELECTROSURGICAL) ×1 IMPLANT
GAUZE PETRO XEROFOAM 1X8 (MISCELLANEOUS) ×3 IMPLANT
GAUZE SPONGE 4X4 12PLY STRL (GAUZE/BANDAGES/DRESSINGS) ×6 IMPLANT
GLOVE BIOGEL PI IND STRL 9 (GLOVE) ×1 IMPLANT
GLOVE BIOGEL PI INDICATOR 9 (GLOVE) ×2
GLOVE SURG 9.0 ORTHO LTXF (GLOVE) ×6 IMPLANT
GOWN STRL REUS TWL 2XL XL LVL4 (GOWN DISPOSABLE) ×3 IMPLANT
GOWN STRL REUS W/ TWL LRG LVL3 (GOWN DISPOSABLE) ×1 IMPLANT
GOWN STRL REUS W/ TWL LRG LVL4 (GOWN DISPOSABLE) ×1 IMPLANT
GOWN STRL REUS W/TWL LRG LVL3 (GOWN DISPOSABLE) ×3
GOWN STRL REUS W/TWL LRG LVL4 (GOWN DISPOSABLE) ×15
IV LACTATED RINGER IRRG 3000ML (IV SOLUTION) ×66
IV LR IRRIG 3000ML ARTHROMATIC (IV SOLUTION) ×6 IMPLANT
KIT STABILIZATION SHOULDER (MISCELLANEOUS) ×3 IMPLANT
KIT SUTURE 2.8 Q-FIX DISP (MISCELLANEOUS) ×2 IMPLANT
KIT SUTURETAK 3.0 INSERT PERC (KITS) IMPLANT
KIT TURNOVER KIT A (KITS) ×3 IMPLANT
MANIFOLD NEPTUNE II (INSTRUMENTS) ×3 IMPLANT
MASK FACE SPIDER DISP (MASK) ×3 IMPLANT
MAT ABSORB  FLUID 56X50 GRAY (MISCELLANEOUS) ×4
MAT ABSORB FLUID 56X50 GRAY (MISCELLANEOUS) ×2 IMPLANT
NDL SAFETY ECLIPSE 18X1.5 (NEEDLE) ×1 IMPLANT
NEEDLE HYPO 18GX1.5 SHARP (NEEDLE) ×3
NEEDLE HYPO 22GX1.5 SAFETY (NEEDLE) ×3 IMPLANT
NS IRRIG 500ML POUR BTL (IV SOLUTION) ×3 IMPLANT
PACK ARTHROSCOPY SHOULDER (MISCELLANEOUS) ×3 IMPLANT
PAD WRAPON POLAR SHDR XLG (MISCELLANEOUS) ×1 IMPLANT
PASSER SUT CAPTURE FIRST (SUTURE) ×2 IMPLANT
SET TUBE SUCT SHAVER OUTFL 24K (TUBING) ×3 IMPLANT
SET TUBE TIP INTRA-ARTICULAR (MISCELLANEOUS) ×3 IMPLANT
STRAP SAFETY 5IN WIDE (MISCELLANEOUS) ×3 IMPLANT
STRIP CLOSURE SKIN 1/2X4 (GAUZE/BANDAGES/DRESSINGS) ×4 IMPLANT
SUT ETHILON 4-0 (SUTURE) ×6
SUT ETHILON 4-0 FS2 18XMFL BLK (SUTURE) ×2
SUT LASSO 90 DEG SD STR (SUTURE) IMPLANT
SUT MNCRL 4-0 (SUTURE) ×3
SUT MNCRL 4-0 27XMFL (SUTURE) ×1
SUT PDS AB 0 CT1 27 (SUTURE) ×1 IMPLANT
SUT PERFECTPASSER WHITE CART (SUTURE) ×6 IMPLANT
SUT SMART STITCH CARTRIDGE (SUTURE) ×6 IMPLANT
SUT VIC AB 0 CT1 36 (SUTURE) ×3 IMPLANT
SUT VIC AB 2-0 CT2 27 (SUTURE) ×3 IMPLANT
SUTURE ETHLN 4-0 FS2 18XMF BLK (SUTURE) ×1 IMPLANT
SUTURE MAGNUM WIRE 2X48 BLK (SUTURE) IMPLANT
SUTURE MNCRL 4-0 27XMF (SUTURE) ×1 IMPLANT
SYR 10ML LL (SYRINGE) ×3 IMPLANT
TAPE MICROFOAM 4IN (TAPE) ×3 IMPLANT
TUBING ARTHRO INFLOW-ONLY STRL (TUBING) ×3 IMPLANT
TUBING CONNECTING 10 (TUBING) ×2 IMPLANT
TUBING CONNECTING 10' (TUBING) ×1
WAND HAND CNTRL MULTIVAC 90 (MISCELLANEOUS) ×3 IMPLANT
WRAPON POLAR PAD SHDR XLG (MISCELLANEOUS) ×3

## 2018-05-05 NOTE — Discharge Instructions (Signed)
AMBULATORY SURGERY  DISCHARGE INSTRUCTIONS   1) The drugs that you were given will stay in your system until tomorrow so for the next 24 hours you should not:  A) Drive an automobile B) Make any legal decisions C) Drink any alcoholic beverage   2) You may resume regular meals tomorrow.  Today it is better to start with liquids and gradually work up to solid foods.  You may eat anything you prefer, but it is better to start with liquids, then soup and crackers, and gradually work up to solid foods.   3) Please notify your doctor immediately if you have any unusual bleeding, trouble breathing, redness and pain at the surgery site, drainage, fever, or pain not relieved by medication.    4) Additional Instructions:    Please contact your physician with any problems or Same Day Surgery at [phone], Monday through Friday 6 am to 4 pm, or [place] at [facility] Main number at [phone].       Interscalene Nerve Block with Exparel  1.  For your surgery you have received an Interscalene Nerve Block with Exparel. 2. Nerve Blocks affect many types of nerves, including nerves that control movement, pain and normal sensation.  You may experience feelings such as numbness, tingling, heaviness, weakness or the inability to move your arm or the feeling or sensation that your arm has "fallen asleep". 3. A nerve block with Exparel can last up to 5 days.  Usually the weakness wears off first.  The tingling and heaviness usually wear off next.  Finally you may start to notice pain.  Keep in mind that this may occur in any order.  Once a nerve block starts to wear off it is usually completely gone within 60 minutes. 4. ISNB may cause mild shortness of breath, a hoarse voice, blurry vision, unequal pupils, or drooping of the face on the same side as the nerve block.  These symptoms will usually resolve with the numbness.  Very rarely the procedure itself can cause mild seizures. 5. If needed,  your surgeon will give you a prescription for pain medication.  It will take about 60 minutes for the oral pain medication to become fully effective.  So, it is recommended that you start taking this medication before the nerve block first begins to wear off, or when you first begin to feel discomfort. 6. Take your pain medication only as prescribed.  Pain medication can cause sedation and decrease your breathing if you take more than you need for the level of pain that you have. 7. Nausea is a common side effect of many pain medications.  You may want to eat something before taking your pain medicine to prevent nausea. 8. After an Interscalene nerve block, you cannot feel pain, pressure or extremes in temperature in the effected arm.  Because your arm is numb it is at an increased risk for injury.  To decrease the possibility of injury, please practice the following:  a. While you are awake change the position of your arm frequently to prevent too much pressure on any one area for prolonged periods of time. b.  If you have a cast or tight dressing, check the color or your fingers every couple of hours.  Call your surgeon with the appearance of any discoloration (white or blue). c. If you are given a sling to wear before you go home, please wear it  at all times until the block has completely worn off.  Do not  get up at night without your sling. d. Please contact [facility]est[facility] or your surgeon if you do not begin to regain sensation after 7 days from the surgery.  Anesthesia may be contacted by calling the Same Day Surgery Department, Mon. through Fri., 6 am to 4 pm at [phone].   e. If you experience any other problems or concerns, please contact your surgeon's office. If you experience severe or prolonged shortness of breath go to the nearest emergency department.   Interscalene Nerve Block with Exparel   For your surgery you have received an Interscalene Nerve Block with Exparel. Nerve Blocks  affect many types of nerves, including nerves that control movement, pain and normal sensation.  You may experience feelings such as numbness, tingling, heaviness, weakness or the inability to move your arm or the feeling or sensation that your arm has "fallen asleep". A nerve block with Exparel can last up to 5 days.  Usually the weakness wears off first.  The tingling and heaviness usually wear off next.  Finally you may start to notice pain.  Keep in mind that this may occur in any order.  Once a nerve block starts to wear off it is usually completely gone within 60 minutes. ISNB may cause mild shortness of breath, a hoarse voice, blurry vision, unequal pupils, or drooping of the face on the same side as the nerve block.  These symptoms will usually resolve with the numbness.  Very rarely the procedure itself can cause mild seizures. If needed, your surgeon will give you a prescription for pain medication.  It will take about 60 minutes for the oral pain medication to become fully effective.  So, it is recommended that you start taking this medication before the nerve block first begins to wear off, or when you first begin to feel discomfort. Take your pain medication only as prescribed.  Pain medication can cause sedation and decrease your breathing if you take more than you need for the level of pain that you have. Nausea is a common side effect of many pain medications.  You may want to eat something before taking your pain medicine to prevent nausea. After an Interscalene nerve block, you cannot feel pain, pressure or extremes in temperature in the effected arm.  Because your arm is numb it is at an increased risk for injury.  To decrease the possibility of injury, please practice the following:  While you are awake change the position of your arm frequently to prevent too much pressure on any one area for prolonged periods of time.  If you have a cast or tight dressing, check the color or your  fingers every couple of hours.  Call your surgeon with the appearance of any discoloration (white or blue). If you are given a sling to wear before you go home, please wear it  at all times until the block has completely worn off.  Do not get up at night without your sling. Please contact [facility] or your surgeon if you do not begin to regain sensation after 7 days from the surgery.  Anesthesia may be contacted by calling the Same Day Surgery Department, Mon. through Fri., 6 am to 4 pm at [phone].   If you experience any other problems or concerns, please contact your surgeon's office. f. If you experience severe or prolonged shortness of breath go to the nearest emergency department.

## 2018-05-05 NOTE — Op Note (Signed)
05/05/2018  11:51 AM  PATIENT:  Jason Scott.  52 y.o. male  PRE-OPERATIVE DIAGNOSIS: Right shoulder full thickness rotator cuff tear, partial-thickness tear of the infraspinatus, medially dislocated biceps tendon with acromioclavicular arthrosis  POST-OPERATIVE DIAGNOSIS: Right shoulder full-thickness rotator cuff tear involving the supra and infraspinatus, high-grade partial-thickness tear of the supraspinatus, high-grade partial-thickness tear of the biceps tendon within the intertuberous groove, subacromial impingement and acromioclavicular arthrosis  PROCEDURE:  Procedure(s): RIGHT SHOULDER ARTHROSCOPIC SUBCAPSULAR REPAIR, SUBACROMINAL DECOMPRESSION, DISTAL CLAVICLE EXCISION, BICEPS TENOTOMY AND MINI OPEN ROTATOR CUFF REPAIR  SURGEON:  Surgeon(s) and Role:    Thornton Park, MD - Primary  ANESTHESIA:   local, general and paracervical block   PREOPERATIVE INDICATIONS:  Jason Scott. is a  52 y.o. male with a diagnosis of full thickness rotator cuff tear right who failed conservative measures and elected for surgical management.    The risks benefits and alternatives were discussed with the patient preoperatively including but not limited to the risks of infection, bleeding, nerve injury, persistent pain or weakness, failure of the hardware, re-tear of the rotator cuff and the need for further surgery. Medical risks include DVT and pulmonary embolism, myocardial infarction, stroke, pneumonia, respiratory failure and death. Patient understood these risks and wished to proceed.  OPERATIVE IMPLANTS: Smith & Nephew multi fix anchors x 3 & Smith and Nephew Q Fix anchors x 2  OPERATIVE PROCEDURE: The patient was met in the preoperative area. The right shoulder was signed with the word yes and my initials according the hospital's correct site of surgery protocol.  Patient had an interscalene block with Exparel by the anesthesia service preoperative area.  The patient is brought to the  OR and underwent general endotracheal intubation by the anesthesia service.  The patient was placed in a beachchair position. A spider arm positioner was used for this case. Examination under anesthesia revealed no loss of passive range of motion and no instability to load-and-shift testing.  The patient was prepped and draped in a sterile fashion. A timeout was performed to verify the patient's name, date of birth, medical record number, correct site of surgery and correct procedure to be performed there was also used to verify the patient received antibiotics that all appropriate instruments, implants and radiographs studies were available in the room. Once all in attendance were in agreement case began.  Bony landmarks were drawn out with a surgical marker along with proposed arthroscopy incisions. These were pre-injected with 1% lidocaine plain. An 11 blade was used to establish a posterior portal through which the arthroscope was placed in the glenohumeral joint. A full diagnostic examination of the shoulder was performed. The anterior portal was established under direct visualization with an 18-gauge spinal needle.  A 5.75 mm arthroscopic cannula was placed through the anterior portal.   The intra-articular portion of the biceps tendon was found to have a partial tear involving greater than 50% of the diameter of the portion of the biceps tendon within the intertubercular groove.  The biceps tendon was also medially dislocated given the high-grade partial-thickness tear of the subscapularis.  Therefore the decision was made to perform a tenotomy since the torn portion of the tendon was within the intertuberous groove and would likely cause persistent pain if a tenodesis was performed.. An arthroscopic scissor was used to release the biceps tendon off the superior labrum. The arthroscopic shaver was then used to debride the frayed edges of the labrum. There were no anterior or superior  labral tears  seen.    An anterolateral dome was then created using an 18-gauge spinal needle for localization under direct visualization.  The high grade partial-thickness subscapularis tendon was addressed by placing 2 perfect Pass sutures in the superior lateral aspect of the subscapularis tendon after the torn portion of the tendon was debrided along with the lesser tuberosity footprint.  A Smith & Nephew multi fix anchor was then used to reduce and fix the torn superior lateral portion of the subscapularis.  Arthroscopic images of the repair were taken.  The arthroscope was then placed in the subacromial space. A lateral portal was then established using an 18-gauge spinal needle for localization.   The greater tuberosity was debrided using a 5.5 mm resector shaver blade to remove all remaining foreign fibers of the rotator cuff.  Debridement was performed until punctate bleeding was seen at the greater tuberosity footprint, which will allow for rotator cuff healing.  Extensive bursitis was encountered and debrided using a 4-0 resector shaver blade and a 90 ArthroCare wand from the lateral portal. A subacromial decompression was also performed using a 5.5 mm resector shaver blade from the lateral portal. The 5.5 mm resector shaver blade was then placed through the anterior portal and distal clavicle excision was performed. Three ArthroCare Perfect Pass sutures were placed in the lateral border of the rotator cuff tear. All arthroscopic instruments were then removed and the mini-open portion of the procedure began.   A saber-type incision was made along the lateral border of the acromion. The deltoid muscle was identified and split in line with its fibers which allowed visualization of the rotator cuff. The Perfect Pass sutures previously placed in the lateral border of the rotator cuff werealso brought out through the deltoid split. Two Q-Fix anchors were then placed at the articular margin of the humeral head  and greater tuberosity. The four suture limbs of each of the Q Fix anchors were passed medially through the rotator cuff using a first pass suture passer. The Perfect Pass sutures from the lateral border of the rotator cuff were then anchored to thegreater tuberosity of the humeral head using Two Multifix anchors. These anchors were tensioned to allow for anatomic reduction of the rotator cuff to the greater tuberosity footprint. The medial row repair was then completed using an arthroscopic knot tying technique with the Q fix anchor sutures. Once all sutures were tied down, arthroscopic images of the double row repair were taken with the arthroscope both externally and arthroscopically fromthe glenohumeral joint  All incisions were copiously irrigated. The deltoid fascia was repaired using a 0 Vicryl suturean interrupted fashion. The subcutaneous tissue of all incisions were closed with a 2-0 Vicryl. Skin closure for the arthroscopic incisions was performed with 4-0 nylon. The skin edges of the saber incision were approximated with a running 4-0 undyed Monocryl. 0.25%  Marcaine plain was injected into the subacromial space and at the injection sites for postoperative pain management.  A dry sterile dressing including Steri-Strips was applied . The patient was placed in an abduction sling, with a Polar Care sleeve.  All sharp,sponge and instrument counts were correct at the conclusion of the case. I was scrubbed and present for the entire case. I spoke with the patient's family in the post-op consultation room and informed them that the case had been performed without complication and the patient was stable in recovery room.     Timoteo Gaul, MD

## 2018-05-05 NOTE — Anesthesia Post-op Follow-up Note (Signed)
Anesthesia QCDR form completed.        

## 2018-05-05 NOTE — H&P (Signed)
PREOPERATIVE H&P  Chief Complaint: full thickness rotator cuff tear right shoulder   HPI: Jason Scott. is a 52 y.o. male who presents for preoperative history and physical with a diagnosis of full thickness rotator cuff tear right. Symptoms of pain and weakness are significantly impairing activities of daily living and at work.  He has elected for surgical repair.   Past Medical History:  Diagnosis Date  . Arthritis   . Complication of anesthesia   . Hypercholesteremia   . PONV (postoperative nausea and vomiting)    nausea only   Past Surgical History:  Procedure Laterality Date  . CERVICAL FUSION  2001   Social History   Socioeconomic History  . Marital status: Married    Spouse name: Not on file  . Number of children: Not on file  . Years of education: Not on file  . Highest education level: Not on file  Occupational History  . Not on file  Social Needs  . Financial resource strain: Not on file  . Food insecurity:    Worry: Not on file    Inability: Not on file  . Transportation needs:    Medical: Not on file    Non-medical: Not on file  Tobacco Use  . Smoking status: Never Smoker  . Smokeless tobacco: Never Used  Substance and Sexual Activity  . Alcohol use: Yes    Comment: beer occ  . Drug use: Never  . Sexual activity: Not on file  Lifestyle  . Physical activity:    Days per week: Not on file    Minutes per session: Not on file  . Stress: Not on file  Relationships  . Social connections:    Talks on phone: Not on file    Gets together: Not on file    Attends religious service: Not on file    Active member of club or organization: Not on file    Attends meetings of clubs or organizations: Not on file    Relationship status: Not on file  Other Topics Concern  . Not on file  Social History Narrative  . Not on file   Family History  Problem Relation Age of Onset  . Clotting disorder Father   . Clotting disorder Paternal Aunt   . Clotting  disorder Paternal Uncle    Allergies  Allergen Reactions  . Penicillins Other (See Comments)    Has patient had a PCN reaction causing immediate rash, facial/tongue/throat swelling, SOB or lightheadedness with hypotension: Unknown Has patient had a PCN reaction causing severe rash involving mucus membranes or skin necrosis: Unknown Has patient had a PCN reaction that required hospitalization: No Has patient had a PCN reaction occurring within the last 10 years: No Childhood reaction If all of the above answers are "NO", then may proceed with Cephalosporin use.    Prior to Admission medications   Medication Sig Start Date End Date Taking? Authorizing Provider  acetaminophen (TYLENOL) 500 MG tablet Take 500-1,000 mg by mouth every 6 (six) hours as needed (for pain.).   Yes [provider]  aspirin EC 81 MG tablet Take 81 mg by mouth daily.   Yes [provider]  atorvastatin (LIPITOR) 80 MG tablet Take 80 mg by mouth every morning.    Yes [provider]     Positive ROS: All other systems have been reviewed and were otherwise negative with the exception of those mentioned in the HPI and as above.  Physical Exam: General: Alert, no  acute distress Cardiovascular: Regular rate and rhythm, no murmurs rubs or gallops.  No pedal edema Respiratory: Clear to auscultation bilaterally, no wheezes rales or rhonchi. No cyanosis, no use of accessory musculature GI: No organomegaly, abdomen is soft and non-tender nondistended with positive bowel sounds. Skin: Skin intact, no lesions within the operative field. Neurologic: Sensation intact distally Psychiatric: Patient is competent for consent with normal mood and affect Lymphatic: No cervical lymphadenopathy  MUSCULOSKELETAL: Right shoulder:  Pain with forward elevation and abduction beyond 90 degrees.  Patient has pain with impingement testing.  He has pain with a downward directed force on his abducted shoulder.   Patient has full digital wrist and elbow range of motion, intact sensation to light touch and a palpable radial pulse.  Assessment: full thickness rotator cuff tear right shoulder  Plan: Plan for Procedure(s): RIGHT SHOULDER ARTHROSCOPY WITH MINI-OPEN ROTATOR CUFF REPAIR  I have discussed the details of the operation with the patient and his family.  A preop history and physical was performed.  I have also described to them in detail the postoperative course and his postoperative restrictions.  I discussed the risks and benefits of surgery. The risks include but are not limited to infection, bleeding requiring blood transfusion, nerve or blood vessel injury, joint stiffness or loss of motion, persistent pain, weakness or instability, re-tear of the rotator cuff and hardware failure and the need for further surgery. Medical risks include but are not limited to DVT and pulmonary embolism, myocardial infarction, stroke, pneumonia, respiratory failure and death. Patient understood these risks and wished to proceed.     Jason Scott, Jason Bracco, MD   05/05/2018 8:01 AM

## 2018-05-05 NOTE — Anesthesia Procedure Notes (Signed)
Procedure Name: Intubation Date/Time: 05/05/2018 8:10 AM Performed by: Ginger CarneMichelet, Khandi Kernes, CRNA Pre-anesthesia Checklist: Patient identified, Emergency Drugs available, Suction available, Patient being monitored and Timeout performed Patient Re-evaluated:Patient Re-evaluated prior to induction Oxygen Delivery Method: Circle system utilized Preoxygenation: Pre-oxygenation with 100% oxygen Induction Type: IV induction Ventilation: Mask ventilation without difficulty Laryngoscope Size: McGraph and 4 Grade View: Grade I Tube type: Oral Tube size: 7.5 mm Number of attempts: 1 Airway Equipment and Method: Stylet,  Video-laryngoscopy and Bite block Placement Confirmation: ETT inserted through vocal cords under direct vision,  breath sounds checked- equal and bilateral and positive ETCO2 Secured at: 22 cm Tube secured with: Tape Dental Injury: Teeth and Oropharynx as per pre-operative assessment  Difficulty Due To: Difficulty was anticipated and Difficult Airway- due to reduced neck mobility Comments: S/P cervical fusion

## 2018-05-05 NOTE — Anesthesia Postprocedure Evaluation (Signed)
Anesthesia Post Note  Patient: Jason BaileyJames H Herst Jr.  Procedure(s) Performed: SHOULDER ARTHROSCOPY WITH MINI OPEN ROTATOR CUFF REPAIR,SUBACROMINAL DECOMPRESSION, DISTAL CLAVICLE EXCISION, BICEPS TENOTOMY, SUBCAPSULAR REPAIR (Right Shoulder)  Patient location during evaluation: PACU Anesthesia Type: General Level of consciousness: awake and alert Pain management: pain level controlled Vital Signs Assessment: post-procedure vital signs reviewed and stable Respiratory status: spontaneous breathing, nonlabored ventilation, respiratory function stable and patient connected to nasal cannula oxygen Cardiovascular status: blood pressure returned to baseline and stable Postop Assessment: no apparent nausea or vomiting Anesthetic complications: no     Last Vitals:  Vitals:   05/05/18 1203 05/05/18 1218  BP: 100/61 105/67  Pulse: 61 66  Resp: 15 15  Temp:    SpO2: 98% 96%    Last Pain:  Vitals:   05/05/18 1218  TempSrc:   PainSc: 0-No pain                 Cleda MccreedyJoseph K Piscitello

## 2018-05-05 NOTE — Anesthesia Procedure Notes (Signed)
Anesthesia Regional Block: Interscalene brachial plexus block   Pre-Anesthetic Checklist: ,, timeout performed, Correct Patient, Correct Site, Correct Laterality, Correct Procedure, Correct Position, site marked, Risks and benefits discussed,  Surgical consent,  Pre-op evaluation,  At surgeon's request and post-op pain management  Laterality: Upper and Right  Prep: chloraprep       Needles:  Injection technique: Single-shot  Needle Type: Stimiplex     Needle Length: 5cm  Needle Gauge: 22     Additional Needles:   Procedures:,,,, ultrasound used (permanent image in chart),,,,  Narrative:  Start time: 05/05/2018 7:10 AM End time: 05/05/2018 7:16 AM Injection made incrementally with aspirations every 5 mL.  Performed by: Personally  Anesthesiologist: Wisdom Seybold, Cleda MccreedyJoseph K, MD  Additional Notes: Patient consented for risk and benefits of nerve block including but not limited to nerve damage, failed block, bleeding and infection.  Patient voiced understanding.  Functioning IV was confirmed and monitors were applied.  A 50mm 22ga Stimuplex needle was used. Sterile prep,hand hygiene and sterile gloves were used.  Minimal sedation used for procedure.  No paresthesia endorsed by patient during the procedure.  Negative aspiration and negative test dose prior to incremental administration of local anesthetic. The patient tolerated the procedure well with no immediate complications.

## 2018-05-05 NOTE — Transfer of Care (Signed)
Immediate Anesthesia Transfer of Care Note  Patient: Jason BaileyJames H Bickhart Jr.  Procedure(s) Performed: SHOULDER ARTHROSCOPY WITH MINI OPEN ROTATOR CUFF REPAIR,SUBACROMINAL DECOMPRESSION, DISTAL CLAVICLE EXCISION, BICEPS TENOTOMY, SUBCAPSULAR REPAIR (Right Shoulder)  Patient Location: PACU  Anesthesia Type:General  Level of Consciousness: sedated  Airway & Oxygen Therapy: Patient Spontanous Breathing and Patient connected to face mask oxygen  Post-op Assessment: Report given to RN and Post -op Vital signs reviewed and stable  Post vital signs: Reviewed and stable  Last Vitals:  Vitals Value Taken Time  BP 94/60 05/05/2018 11:48 AM  Temp 36.2 C 05/05/2018 11:48 AM  Pulse 59 05/05/2018 11:49 AM  Resp 11 05/05/2018 11:49 AM  SpO2 99 % 05/05/2018 11:49 AM  Vitals shown include unvalidated device data.  Last Pain:  Vitals:   05/05/18 0721  TempSrc:   PainSc: 0-No pain         Complications: No apparent anesthesia complications

## 2018-05-05 NOTE — Anesthesia Preprocedure Evaluation (Signed)
Anesthesia Evaluation  Patient identified by MRN, date of birth, ID band Patient awake    Reviewed: Allergy & Precautions, H&P , NPO status , Patient's Chart, lab work & pertinent test results  History of Anesthesia Complications (+) PONV and history of anesthetic complications  Airway Mallampati: III  TM Distance: <3 FB Neck ROM: limited    Dental  (+) Chipped   Pulmonary neg pulmonary ROS, neg shortness of breath,           Cardiovascular Exercise Tolerance: Good (-) angina(-) Past MI and (-) DOE negative cardio ROS       Neuro/Psych negative neurological ROS  negative psych ROS   GI/Hepatic negative GI ROS, Neg liver ROS, neg GERD  ,  Endo/Other  negative endocrine ROS  Renal/GU      Musculoskeletal  (+) Arthritis ,   Abdominal   Peds  Hematology negative hematology ROS (+)   Anesthesia Other Findings Past Medical History: No date: Arthritis No date: Complication of anesthesia No date: Hypercholesteremia No date: PONV (postoperative nausea and vomiting)     Comment:  nausea only  Past Surgical History: 2001: CERVICAL FUSION  BMI    Body Mass Index:  25.24 kg/m      Reproductive/Obstetrics negative OB ROS                             Anesthesia Physical Anesthesia Plan  ASA: II  Anesthesia Plan: General ETT   Post-op Pain Management: GA combined w/ Regional for post-op pain   Induction: Intravenous  PONV Risk Score and Plan: Ondansetron, Dexamethasone, Midazolam and Treatment may vary due to age or medical condition  Airway Management Planned: Oral ETT  Additional Equipment:   Intra-op Plan:   Post-operative Plan: Extubation in OR  Informed Consent: I have reviewed the patients History and Physical, chart, labs and discussed the procedure including the risks, benefits and alternatives for the proposed anesthesia with the patient or authorized representative  who has indicated his/her understanding and acceptance.   Dental Advisory Given  Plan Discussed with: Anesthesiologist, CRNA and Surgeon  Anesthesia Plan Comments: (Patient consented for risks of anesthesia including but not limited to:  - adverse reactions to medications - damage to teeth, lips or other oral mucosa - sore throat or hoarseness - Damage to heart, brain, lungs or loss of life  Patient voiced understanding.)        Anesthesia Quick Evaluation

## 2018-05-06 ENCOUNTER — Encounter: Payer: Self-pay | Admitting: Orthopedic Surgery

## 2019-07-29 IMAGING — MR MR SHOULDER*R* W/CM
6 series · 40 of 40 positions shown · IV contrast (agent unspecified)
Comparison: None.

CLINICAL DATA: Right shoulder pain and limited range of motion
since an injury changing a tire in February 2015.

EXAM:
MR ARTHROGRAM OF THE RIGHT SHOULDER
TECHNIQUE: Multiplanar, multisequence MR imaging of the right shoulder was
performed following the administration of intra-articular contrast.
CONTRAST:  See Injection Documentation.

[Series 5: T1 fat-sat · axial · right · 4.0mm · 0.55mm/px · z∈[-32,+88]mm · 6 of 25 slices shown (1 of 3)]
[im 1/25]
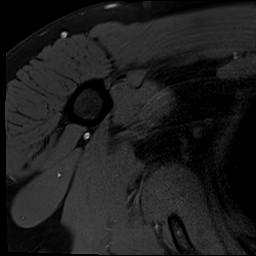
[im 5/25]
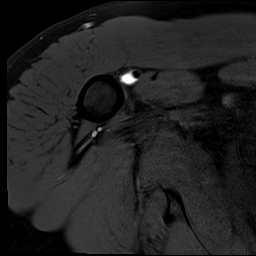
[im 10/25]
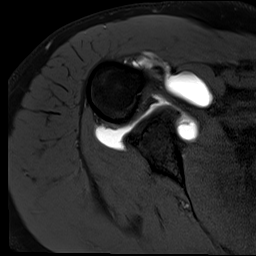
[im 15/25]
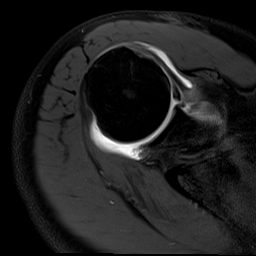
[im 20/25]
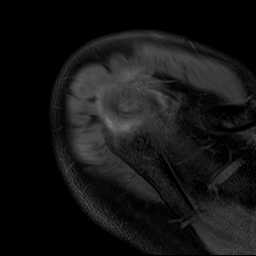
[im 25/25]
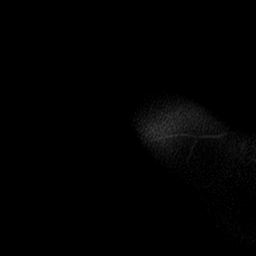

[Series 6: T1 fat-sat · oblique · right · 4.0mm · 0.55mm/px · 6 of 25 slices shown (2 of 3)]
[im 1/25]
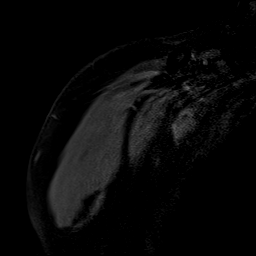
[im 5/25]
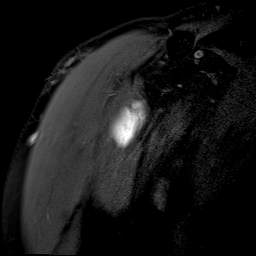
[im 10/25]
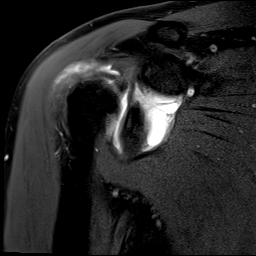
[im 15/25]
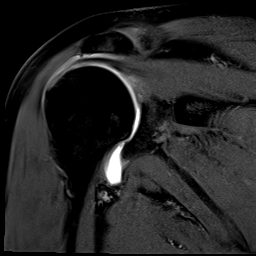
[im 20/25]
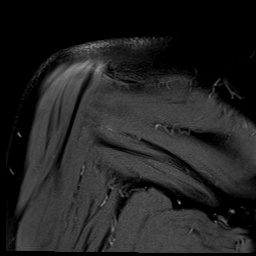
[im 25/25]
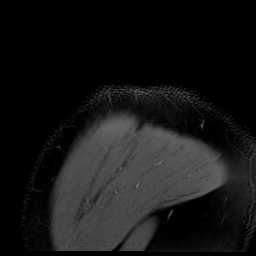

[Series 7: T2 fat-sat · oblique · right · 4.0mm · 0.55mm/px · 7 of 26 slices shown (1 of 2)]
[im 1/26]
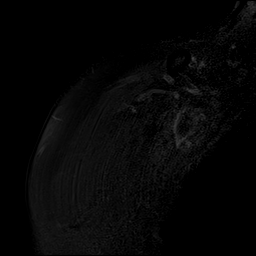
[im 5/26]
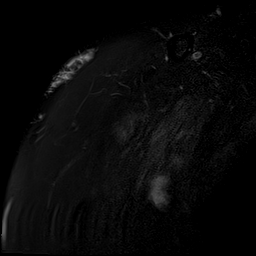
[im 9/26]
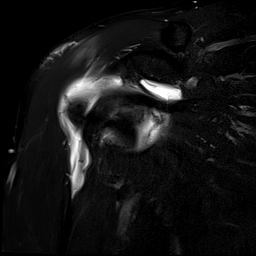
[im 13/26]
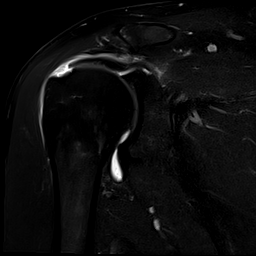
[im 17/26]
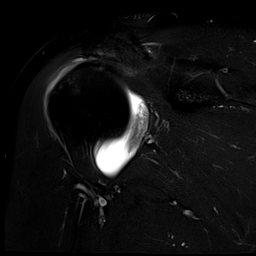
[im 21/26]
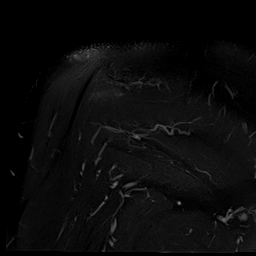
[im 26/26]
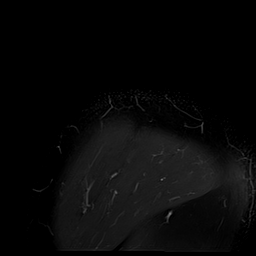

[Series 8: T1 · oblique · right · 4.0mm · 0.51mm/px · 7 of 26 slices shown]
[im 1/26]
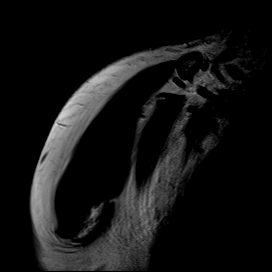
[im 5/26]
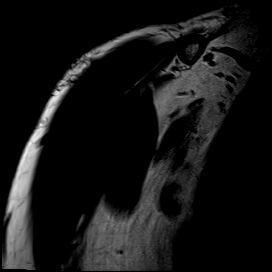
[im 9/26]
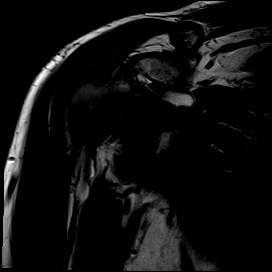
[im 13/26]
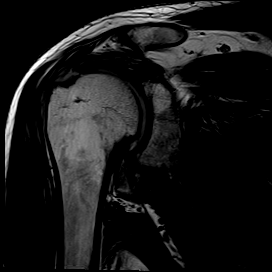
[im 17/26]
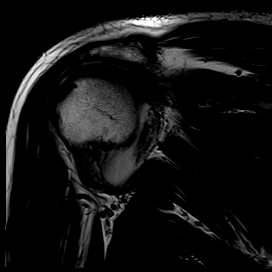
[im 21/26]
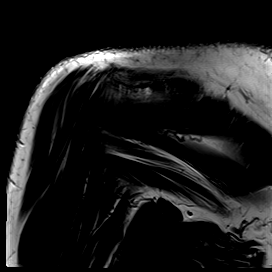
[im 26/26]
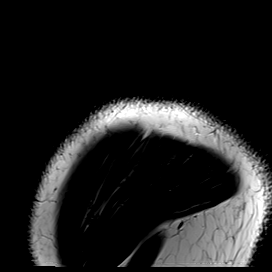

[Series 9: T2 fat-sat · oblique · right · 4.0mm · 0.55mm/px · 7 of 25 slices shown (2 of 2)]
[im 1/25]
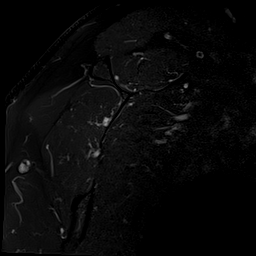
[im 5/25]
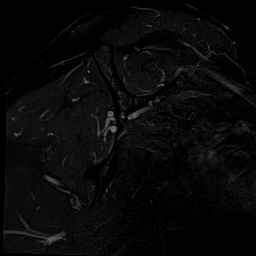
[im 9/25]
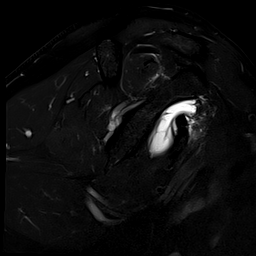
[im 13/25]
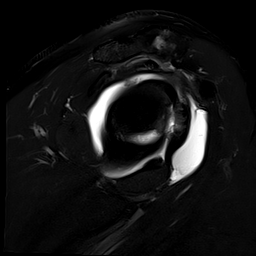
[im 17/25]
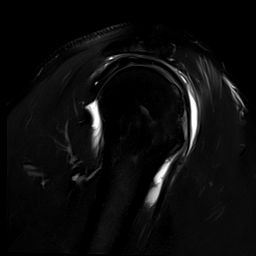
[im 21/25]
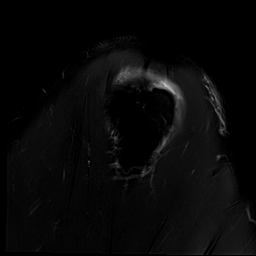
[im 25/25]
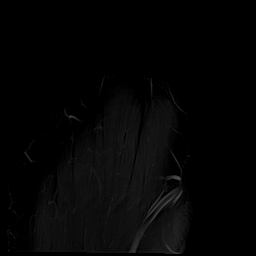

[Series 12: T1 fat-sat · sagittal · right · 4.0mm · 0.62mm/px · 7 of 26 slices shown (3 of 3)]
[im 1/26]
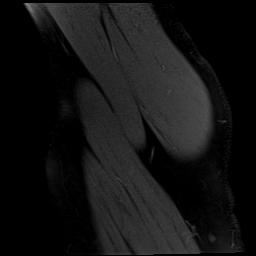
[im 5/26]
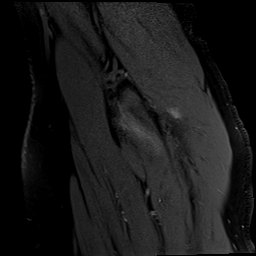
[im 9/26]
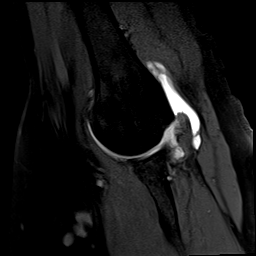
[im 13/26]
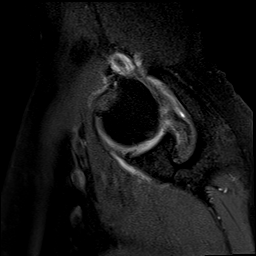
[im 17/26]
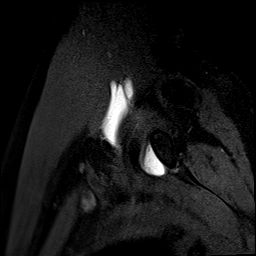
[im 21/26]
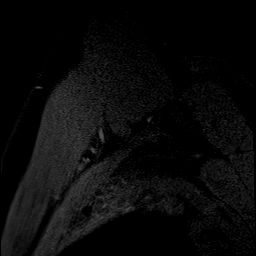
[im 26/26]
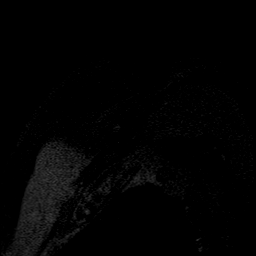

[40 of 40 positions shown; findings below may reference images not displayed]

FINDINGS: Rotator cuff: The patient has rotator cuff tendinopathy with a
partial width, full-thickness tear of the anterior and far lateral
supraspinatus measuring 1.4 cm from front to back. Retraction is
mild at 1-2 cm. The patient also has an undersurface tear of the
subscapularis. Only a thin strand superficial tendon is intact.
There is laminar retraction of 2-3 cm.

Muscles: No focal atrophy or lesion. All imaged muscular
demonstrates mild fatty replacement.

Biceps long head: The tendon is intact but medially subluxed out of
the bicipital groove and into the glenohumeral joint consistent with
a biceps pulley injury.

Acromioclavicular Joint: Moderate degenerative change is seen.

Glenohumeral Joint: Unremarkable.

Labrum: Intact.

Bones: No fracture or worrisome lesion.
IMPRESSION: Rotator cuff tendinopathy with a 1.4 cm from front to back
full-thickness tear of the anterior and far lateral supraspinatus.
Tendon retraction is 1-2 cm. No focal atrophy.

Extensive articular sided tearing of the subscapularis with only a
thin rim superficial tendon intact. Laminar retraction of 2-3 cm is
identified. No focal atrophy.

Intact long head of biceps is medially subluxed out of the bicipital
groove and into the glenohumeral joint consistent with a biceps
pulley injury.

Moderate acromioclavicular osteoarthritis.

## 2020-10-03 ENCOUNTER — Other Ambulatory Visit
Admission: RE | Admit: 2020-10-03 | Discharge: 2020-10-03 | Disposition: A | Discharge status: Home / Self Care | Payer: 59 | Source: Ambulatory Visit | Attending: Orthopedic Surgery | Admitting: Orthopedic Surgery

## 2020-10-03 ENCOUNTER — Other Ambulatory Visit: Payer: Self-pay

## 2020-10-03 ENCOUNTER — Other Ambulatory Visit: Payer: Self-pay | Admitting: Orthopedic Surgery

## 2020-10-03 NOTE — Patient Instructions (Signed)
Your procedure is scheduled on: Tuesday Oct 08, 2020. Report to Day [facility] inside [facility] 2nd floor (stop by admissions desk first before getting on elevator). To find out your arrival time please call [phone] between 1PM - 3PM on Monday Oct 07, 2020.  Remember: Instructions that are not followed completely may result in serious medical risk,  up to and including death, or upon the discretion of your surgeon and anesthesiologist your  surgery may need to be rescheduled.     _X__ 1. Do not eat food after midnight the night before your procedure.                 No chewing gum or hard candies.                __X__2.  On the morning of surgery brush your teeth with toothpaste and water, you                may rinse your mouth with mouthwash if you wish.  Do not swallow any toothpaste of mouthwash.     _X__ 3.  No Alcohol for 24 hours before or after surgery.   _X__ 4.  Do Not Smoke or use e-cigarettes For 24 Hours Prior to Your Surgery.                 Do not use any chewable tobacco products for at least 6 hours prior to                 Surgery.  _X__  5.  Do not use any recreational drugs (marijuana, cocaine, heroin, ecstasy, MDMA or other)                For at least one week prior to your surgery.  Combination of these drugs with anesthesia                May have life threatening results.  __X__ 6.  Notify your doctor if there is any change in your medical condition      (cold, fever, infections).     Do not wear jewelry, make-up, hairpins, clips or nail polish. Do not wear lotions, powders, or perfumes deodorant. Do not shave 48 hours prior to surgery. Men may shave face and neck. Do not bring valuables to the hospital.    [facility] is not responsible for any belongings or valuables.  Contacts, dentures or bridgework may not be worn into surgery. Leave your suitcase in the car. After surgery it may be brought to your room. For patients  admitted to the hospital, discharge time is determined by your treatment team.   Patients discharged the day of surgery will not be allowed to drive home.   Make arrangements for someone to be with you for the first 24 hours of your Same Day Discharge.   _X___ Take these medicines the morning of surgery with A SIP OF WATER:    1. None   2.   3.   4.  5.  6.  ____ Fleet Enema (as directed)   __X__ Use CHG Soap (or wipes) as directed  ____ Use Benzoyl Peroxide Gel as instructed  ____ Use inhalers on the day of surgery  ____ Stop metformin 2 days prior to surgery    ____ Take 1/2 of usual insulin dose the night before surgery. No insulin the morning          of surgery.   __X__ Stop aspirin EC  81 MG today 10/03/2020  __X__ One Week prior to surgery- Stop Anti-inflammatories such as Ibuprofen, Aleve, Advil, Motrin, meloxicam (MOBIC), diclofenac, etodolac, ketorolac, Toradol, Daypro, piroxicam, Goody's or BC powders. OK TO USE TYLENOL IF NEEDED   __X__ Stop supplements until after surgery.    ____ Bring C-Pap to the hospital.    If you have any questions regarding your pre-procedure instructions,  Please call Pre-admit Testing at [phone].

## 2020-10-04 ENCOUNTER — Other Ambulatory Visit
Admission: RE | Admit: 2020-10-04 | Discharge: 2020-10-04 | Disposition: A | Discharge status: Home / Self Care | Payer: 59 | Source: Ambulatory Visit | Attending: Orthopedic Surgery | Admitting: Orthopedic Surgery

## 2020-10-04 DIAGNOSIS — Z01812 Encounter for preprocedural laboratory examination: Secondary | ICD-10-CM | POA: Insufficient documentation

## 2020-10-04 LAB — CBC WITH DIFFERENTIAL/PLATELET
Abs Immature Granulocytes: 0.03 10*3/uL (ref 0.00–0.07)
Basophils Absolute: 0.1 10*3/uL (ref 0.0–0.1)
Basophils Relative: 1 %
Eosinophils Absolute: 0.1 10*3/uL (ref 0.0–0.5)
Eosinophils Relative: 2 %
HCT: 43 % (ref 39.0–52.0)
Hemoglobin: 15.3 g/dL (ref 13.0–17.0)
Immature Granulocytes: 0 %
Lymphocytes Relative: 29 %
Lymphs Abs: 2 10*3/uL (ref 0.7–4.0)
MCH: 30.5 pg (ref 26.0–34.0)
MCHC: 35.6 g/dL (ref 30.0–36.0)
MCV: 85.7 fL (ref 80.0–100.0)
Monocytes Absolute: 0.6 10*3/uL (ref 0.1–1.0)
Monocytes Relative: 9 %
Neutro Abs: 4.1 10*3/uL (ref 1.7–7.7)
Neutrophils Relative %: 59 %
Platelets: 285 10*3/uL (ref 150–400)
RBC: 5.02 MIL/uL (ref 4.22–5.81)
RDW: 11.7 % (ref 11.5–15.5)
WBC: 6.8 10*3/uL (ref 4.0–10.5)
nRBC: 0 % (ref 0.0–0.2)

## 2020-10-04 LAB — BASIC METABOLIC PANEL
Anion gap: 8 (ref 5–15)
BUN: 16 mg/dL (ref 6–20)
CO2: 26 mmol/L (ref 22–32)
Calcium: 9.5 mg/dL (ref 8.9–10.3)
Chloride: 106 mmol/L (ref 98–111)
Creatinine, Ser: 0.7 mg/dL (ref 0.61–1.24)
GFR, Estimated: >60 mL/min (ref >60)
Glucose, Bld: 102 mg/dL — ABNORMAL HIGH (ref 70–99)
Potassium: 4.1 mmol/L (ref 3.5–5.1)
Sodium: 140 mmol/L (ref 135–145)

## 2020-10-04 LAB — PROTIME-INR
INR: 1 (ref 0.8–1.2)
Prothrombin Time: 13.6 seconds (ref 11.4–15.2)

## 2020-10-04 LAB — APTT: aPTT: 28 seconds (ref 24–36)

## 2020-10-08 ENCOUNTER — Ambulatory Visit: Payer: 59

## 2020-10-08 ENCOUNTER — Ambulatory Visit
Admission: RE | Admit: 2020-10-08 | Discharge: 2020-10-08 | Disposition: A | Discharge status: Home / Self Care | Payer: 59 | Attending: Orthopedic Surgery | Admitting: Orthopedic Surgery

## 2020-10-08 ENCOUNTER — Ambulatory Visit: Payer: 59 | Admitting: Certified Registered"

## 2020-10-08 ENCOUNTER — Encounter: Payer: Self-pay | Admitting: Orthopedic Surgery

## 2020-10-08 ENCOUNTER — Encounter
Admission: RE | Disposition: A | Discharge status: Home / Self Care | Payer: Self-pay | Source: Home / Self Care | Attending: Orthopedic Surgery

## 2020-10-08 DIAGNOSIS — Z419 Encounter for procedure for purposes other than remedying health state, unspecified: Secondary | ICD-10-CM

## 2020-10-08 DIAGNOSIS — Z7982 Long term (current) use of aspirin: Secondary | ICD-10-CM | POA: Diagnosis not present

## 2020-10-08 DIAGNOSIS — M19012 Primary osteoarthritis, left shoulder: Secondary | ICD-10-CM | POA: Insufficient documentation

## 2020-10-08 DIAGNOSIS — M75122 Complete rotator cuff tear or rupture of left shoulder, not specified as traumatic: Secondary | ICD-10-CM | POA: Insufficient documentation

## 2020-10-08 DIAGNOSIS — E7800 Pure hypercholesterolemia, unspecified: Secondary | ICD-10-CM | POA: Insufficient documentation

## 2020-10-08 DIAGNOSIS — Z880 Allergy status to penicillin: Secondary | ICD-10-CM | POA: Insufficient documentation

## 2020-10-08 HISTORY — PX: SHOULDER ARTHROSCOPY WITH OPEN ROTATOR CUFF REPAIR AND DISTAL CLAVICLE ACROMINECTOMY: SHX5683

## 2020-10-08 SURGERY — SHOULDER ARTHROSCOPY WITH OPEN ROTATOR CUFF REPAIR AND DISTAL CLAVICLE ACROMINECTOMY
Anesthesia: General | Site: Shoulder | Laterality: Left

## 2020-10-08 MED ORDER — NEOMYCIN-POLYMYXIN B GU 40-200000 IR SOLN
Status: AC
  Filled 2020-10-08: qty 20

## 2020-10-08 MED ORDER — BUPIVACAINE LIPOSOME 1.3 % IJ SUSP
INTRAMUSCULAR | Status: AC
  Filled 2020-10-08: qty 20

## 2020-10-08 MED ORDER — EPINEPHRINE PF 1 MG/ML IJ SOLN
INTRAMUSCULAR | Status: AC
  Filled 2020-10-08: qty 12

## 2020-10-08 MED ORDER — PROPOFOL 10 MG/ML IV BOLUS
INTRAVENOUS | Status: AC
  Filled 2020-10-08: qty 20

## 2020-10-08 MED ORDER — ONDANSETRON HCL 4 MG PO TABS: 4.0000 mg | ORAL_TABLET | Freq: Three times a day (TID) | ORAL | 0 refills | Status: AC | PRN

## 2020-10-08 MED ORDER — CHLORHEXIDINE GLUCONATE 0.12 % MT SOLN
OROMUCOSAL | Status: AC
  Administered 2020-10-08: 15 mL via OROMUCOSAL
  Filled 2020-10-08: qty 15

## 2020-10-08 MED ORDER — FENTANYL CITRATE (PF) 100 MCG/2ML IJ SOLN
INTRAMUSCULAR | Status: AC
  Administered 2020-10-08: 25 ug via INTRAVENOUS
  Filled 2020-10-08: qty 2

## 2020-10-08 MED ORDER — OXYCODONE HCL 5 MG PO TABS
ORAL_TABLET | ORAL | Status: AC
  Filled 2020-10-08: qty 1

## 2020-10-08 MED ORDER — CEFAZOLIN SODIUM-DEXTROSE 2-4 GM/100ML-% IV SOLN
2.0000 g | INTRAVENOUS | Status: AC
  Administered 2020-10-08: 2 g via INTRAVENOUS

## 2020-10-08 MED ORDER — CHLORHEXIDINE GLUCONATE 0.12 % MT SOLN: 15.0000 mL | Freq: Once | OROMUCOSAL | Status: AC

## 2020-10-08 MED ORDER — ACETAMINOPHEN 500 MG PO TABS: 1000.0000 mg | ORAL_TABLET | ORAL | Status: AC

## 2020-10-08 MED ORDER — MIDAZOLAM HCL 2 MG/2ML IJ SOLN: 1.0000 mg | Freq: Once | INTRAMUSCULAR | Status: AC

## 2020-10-08 MED ORDER — LACTATED RINGERS IV SOLN: INTRAVENOUS | Status: DC | PRN

## 2020-10-08 MED ORDER — BUPIVACAINE HCL (PF) 0.5 % IJ SOLN
INTRAMUSCULAR | Status: DC | PRN
  Administered 2020-10-08: 5 mL via PERINEURAL

## 2020-10-08 MED ORDER — LIDOCAINE HCL (CARDIAC) PF 100 MG/5ML IV SOSY
PREFILLED_SYRINGE | INTRAVENOUS | Status: DC | PRN
  Administered 2020-10-08: 80 mg via INTRAVENOUS

## 2020-10-08 MED ORDER — CHLORHEXIDINE GLUCONATE CLOTH 2 % EX PADS
6.0000 | MEDICATED_PAD | Freq: Once | CUTANEOUS | Status: AC
  Administered 2020-10-08: 6 via TOPICAL

## 2020-10-08 MED ORDER — FENTANYL CITRATE (PF) 100 MCG/2ML IJ SOLN: 50.0000 ug | Freq: Once | INTRAMUSCULAR | Status: AC

## 2020-10-08 MED ORDER — NEOMYCIN-POLYMYXIN B GU 40-200000 IR SOLN
Status: DC | PRN
  Administered 2020-10-08: 2 mL

## 2020-10-08 MED ORDER — ROCURONIUM BROMIDE 100 MG/10ML IV SOLN
INTRAVENOUS | Status: DC | PRN
  Administered 2020-10-08: 60 mg via INTRAVENOUS
  Administered 2020-10-08: 20 mg via INTRAVENOUS

## 2020-10-08 MED ORDER — MIDAZOLAM HCL 2 MG/2ML IJ SOLN
INTRAMUSCULAR | Status: AC
  Administered 2020-10-08: 1 mg via INTRAVENOUS
  Filled 2020-10-08: qty 2

## 2020-10-08 MED ORDER — BUPIVACAINE HCL (PF) 0.5 % IJ SOLN
INTRAMUSCULAR | Status: AC
  Filled 2020-10-08: qty 10

## 2020-10-08 MED ORDER — LIDOCAINE HCL (PF) 1 % IJ SOLN
INTRAMUSCULAR | Status: DC | PRN
  Administered 2020-10-08: 3 mL via SUBCUTANEOUS

## 2020-10-08 MED ORDER — LIDOCAINE HCL (PF) 1 % IJ SOLN
INTRAMUSCULAR | Status: AC
  Filled 2020-10-08: qty 5

## 2020-10-08 MED ORDER — DEXAMETHASONE SODIUM PHOSPHATE 10 MG/ML IJ SOLN
INTRAMUSCULAR | Status: DC | PRN
  Administered 2020-10-08: 10 mg via INTRAVENOUS

## 2020-10-08 MED ORDER — FAMOTIDINE 20 MG PO TABS
ORAL_TABLET | ORAL | Status: AC
  Administered 2020-10-08: 20 mg via ORAL
  Filled 2020-10-08: qty 1

## 2020-10-08 MED ORDER — BUPIVACAINE LIPOSOME 1.3 % IJ SUSP
INTRAMUSCULAR | Status: DC | PRN
  Administered 2020-10-08: 20 mL

## 2020-10-08 MED ORDER — ONDANSETRON HCL 4 MG/2ML IJ SOLN: 4.0000 mg | Freq: Once | INTRAMUSCULAR | Status: DC | PRN

## 2020-10-08 MED ORDER — OXYCODONE HCL 5 MG PO TABS
5.0000 mg | ORAL_TABLET | Freq: Once | ORAL | Status: AC
  Administered 2020-10-08: 5 mg via ORAL

## 2020-10-08 MED ORDER — FENTANYL CITRATE (PF) 100 MCG/2ML IJ SOLN
25.0000 ug | INTRAMUSCULAR | Status: DC | PRN
Start: 2020-10-08 — End: 2020-10-08
  Administered 2020-10-08: 25 ug via INTRAVENOUS

## 2020-10-08 MED ORDER — OXYCODONE HCL 5 MG PO TABS: 5.0000 mg | ORAL_TABLET | ORAL | 0 refills | Status: AC | PRN

## 2020-10-08 MED ORDER — FAMOTIDINE 20 MG PO TABS: 20.0000 mg | ORAL_TABLET | Freq: Once | ORAL | Status: AC

## 2020-10-08 MED ORDER — SODIUM CHLORIDE 0.9 % IR SOLN
Status: DC | PRN
  Administered 2020-10-08: 4 mL

## 2020-10-08 MED ORDER — FENTANYL CITRATE (PF) 100 MCG/2ML IJ SOLN
INTRAMUSCULAR | Status: AC
  Administered 2020-10-08: 50 ug via INTRAVENOUS
  Filled 2020-10-08: qty 2

## 2020-10-08 MED ORDER — ONDANSETRON HCL 4 MG/2ML IJ SOLN
INTRAMUSCULAR | Status: DC | PRN
  Administered 2020-10-08: 4 mg via INTRAVENOUS

## 2020-10-08 MED ORDER — PROPOFOL 10 MG/ML IV BOLUS
INTRAVENOUS | Status: DC | PRN
  Administered 2020-10-08: 170 mg via INTRAVENOUS

## 2020-10-08 MED ORDER — ACETAMINOPHEN 500 MG PO TABS
ORAL_TABLET | ORAL | Status: AC
  Administered 2020-10-08: 1000 mg via ORAL
  Filled 2020-10-08: qty 2

## 2020-10-08 MED ORDER — LACTATED RINGERS IV SOLN: INTRAVENOUS | Status: DC

## 2020-10-08 MED ORDER — FENTANYL CITRATE (PF) 100 MCG/2ML IJ SOLN
INTRAMUSCULAR | Status: DC | PRN
  Administered 2020-10-08: 50 ug via INTRAVENOUS
  Administered 2020-10-08 (×2): 25 ug via INTRAVENOUS

## 2020-10-08 MED ORDER — SODIUM CHLORIDE FLUSH 0.9 % IV SOLN
INTRAVENOUS | Status: AC
  Filled 2020-10-08: qty 40

## 2020-10-08 MED ORDER — ORAL CARE MOUTH RINSE: 15.0000 mL | Freq: Once | OROMUCOSAL | Status: AC

## 2020-10-08 MED ORDER — SUGAMMADEX SODIUM 200 MG/2ML IV SOLN
INTRAVENOUS | Status: DC | PRN
  Administered 2020-10-08: 200 mg via INTRAVENOUS

## 2020-10-08 MED ORDER — FENTANYL CITRATE (PF) 100 MCG/2ML IJ SOLN
INTRAMUSCULAR | Status: AC
  Filled 2020-10-08: qty 2

## 2020-10-08 MED ORDER — CEFAZOLIN SODIUM-DEXTROSE 2-4 GM/100ML-% IV SOLN
INTRAVENOUS | Status: AC
  Filled 2020-10-08: qty 100

## 2020-10-08 SURGICAL SUPPLY — 70 items
ADAPTER IRRIG TUBE 2 SPIKE SOL (ADAPTER) ×4 IMPLANT
ADPR TBG 2 SPK PMP STRL ASCP (ADAPTER) ×2
ANCH SUT 5.5 KNTLS PEEK (Orthopedic Implant) ×3 IMPLANT
ANCH SUT Q-FX 2.8 (Anchor) ×2 IMPLANT
ANCHOR ALL-SUT Q-FIX 2.8 (Anchor) ×6 IMPLANT
ANCHOR SUT 5.5 MULTIFIX (Orthopedic Implant) ×3 IMPLANT
BUR RADIUS 4.0X18.5 (BURR) ×2 IMPLANT
BUR RADIUS 5.5 (BURR) ×2 IMPLANT
CANISTER SUCT LVC 12 LTR MEDI- (MISCELLANEOUS) ×2 IMPLANT
CANNULA 5.75X7 CRYSTAL CLEAR (CANNULA) ×4 IMPLANT
CANNULA PARTIAL THREAD 2X7 (CANNULA) ×2 IMPLANT
CANNULA TWIST IN 8.25X9CM (CANNULA) ×4 IMPLANT
CONNECTOR PERFECT PASSER (CONNECTOR) ×4 IMPLANT
COOLER POLAR GLACIER W/PUMP (MISCELLANEOUS) ×2 IMPLANT
COVER WAND RF STERILE (DRAPES) ×2 IMPLANT
DEVICE SUCT BLK HOLE OR FLOOR (MISCELLANEOUS) ×4 IMPLANT
DRAPE 3/4 80X56 (DRAPES) ×2 IMPLANT
DRAPE IMP U-DRAPE 54X76 (DRAPES) ×4 IMPLANT
DRAPE INCISE IOBAN 66X45 STRL (DRAPES) ×2 IMPLANT
DRAPE U-SHAPE 47X51 STRL (DRAPES) ×2 IMPLANT
DURAPREP 26ML APPLICATOR (WOUND CARE) ×6 IMPLANT
ELECT REM PT RETURN 9FT ADLT (ELECTROSURGICAL) ×2
ELECTRODE REM PT RTRN 9FT ADLT (ELECTROSURGICAL) ×1 IMPLANT
GAUZE SPONGE 4X4 12PLY STRL (GAUZE/BANDAGES/DRESSINGS) ×2 IMPLANT
GAUZE XEROFORM 1X8 LF (GAUZE/BANDAGES/DRESSINGS) ×2 IMPLANT
GLOVE SURG 9.0 ORTHO LTXF (GLOVE) ×6 IMPLANT
GLOVE SURG UNDER POLY LF SZ9 (GLOVE) ×2 IMPLANT
GOWN STRL REUS TWL 2XL XL LVL4 (GOWN DISPOSABLE) ×2 IMPLANT
GOWN STRL REUS W/ TWL LRG LVL3 (GOWN DISPOSABLE) ×1 IMPLANT
GOWN STRL REUS W/TWL LRG LVL3 (GOWN DISPOSABLE) ×2
IV LACTATED RINGER IRRG 3000ML (IV SOLUTION) ×20
IV LR IRRIG 3000ML ARTHROMATIC (IV SOLUTION) ×8 IMPLANT
KIT STABILIZATION SHOULDER (MISCELLANEOUS) ×2 IMPLANT
KIT SUTURE 2.8 Q-FIX DISP (MISCELLANEOUS) ×2 IMPLANT
KIT SUTURETAK 3.0 INSERT PERC (KITS) IMPLANT
KIT TURNOVER KIT A (KITS) ×2 IMPLANT
MANIFOLD NEPTUNE II (INSTRUMENTS) ×4 IMPLANT
MASK FACE SPIDER DISP (MASK) ×2 IMPLANT
MAT ABSORB  FLUID 56X50 GRAY (MISCELLANEOUS) ×6
MAT ABSORB FLUID 56X50 GRAY (MISCELLANEOUS) ×3 IMPLANT
NDL SAFETY ECLIPSE 18X1.5 (NEEDLE) ×1 IMPLANT
NEEDLE HYPO 18GX1.5 SHARP (NEEDLE) ×2
NEEDLE HYPO 22GX1.5 SAFETY (NEEDLE) ×2 IMPLANT
NS IRRIG 500ML POUR BTL (IV SOLUTION) ×2 IMPLANT
PACK ARTHROSCOPY SHOULDER (MISCELLANEOUS) ×2 IMPLANT
PAD ARMBOARD 7.5X6 YLW CONV (MISCELLANEOUS) ×4 IMPLANT
PAD WRAPON POLAR SHDR XLG (MISCELLANEOUS) ×1 IMPLANT
PASSER SUT FIRSTPASS SELF (INSTRUMENTS) ×2 IMPLANT
SET TUBE SUCT SHAVER OUTFL 24K (TUBING) ×2 IMPLANT
SET TUBE TIP INTRA-ARTICULAR (MISCELLANEOUS) ×2 IMPLANT
STRIP CLOSURE SKIN 1/2X4 (GAUZE/BANDAGES/DRESSINGS) ×2 IMPLANT
SUT ETHILON 4-0 (SUTURE) ×2
SUT ETHILON 4-0 FS2 18XMFL BLK (SUTURE) ×1
SUT LASSO 90 DEG SD STR (SUTURE) IMPLANT
SUT MNCRL 4-0 (SUTURE) ×2
SUT MNCRL 4-0 27XMFL (SUTURE) ×1
SUT PDS AB 0 CT1 27 (SUTURE) ×6 IMPLANT
SUT PERFECTPASSER WHITE CART (SUTURE) ×8 IMPLANT
SUT SMART STITCH CARTRIDGE (SUTURE) ×8 IMPLANT
SUT ULTRABRAID 2 COBRAID 38 (SUTURE) IMPLANT
SUT VIC AB 0 CT1 36 (SUTURE) ×6 IMPLANT
SUT VIC AB 2-0 CT2 27 (SUTURE) ×2 IMPLANT
SUTURE ETHLN 4-0 FS2 18XMF BLK (SUTURE) ×1 IMPLANT
SUTURE MNCRL 4-0 27XMF (SUTURE) ×1 IMPLANT
SYR 10ML LL (SYRINGE) ×2 IMPLANT
TAPE MICROFOAM 4IN (TAPE) ×2 IMPLANT
TUBING ARTHRO INFLOW-ONLY STRL (TUBING) ×2 IMPLANT
TUBING CONNECTING 10 (TUBING) ×2 IMPLANT
WAND WEREWOLF FLOW 90D (MISCELLANEOUS) ×2 IMPLANT
WRAPON POLAR PAD SHDR XLG (MISCELLANEOUS) ×2

## 2020-10-08 NOTE — Op Note (Signed)
10/08/2020  11:12 AM  PATIENT:  Jason Scott.  55 y.o. male  PRE-OPERATIVE DIAGNOSIS:  Left Shoulder full-thickness rotator Cuff Tear, acromioclavicular joint arthrosis and subacromial impingement  POST-OPERATIVE DIAGNOSIS: Left shoulder full-thickness rotator cuff tear involving the supra and infraspinatus, SLAP tear, biceps tendinosis, partial-thickness tear of the subscapularis, subacromial impingement and acromioclavicular joint arthrosis  PROCEDURE:  Procedure(s): Left shoulder arthroscopic subacromial decompression, distal clavicle excision and mini open rotator cuff repair with biceps tenodesis  SURGEON:  Surgeon(s) and Role:    Thornton Park, MD - Primary  ANESTHESIA:   general and paracervical block   PREOPERATIVE INDICATIONS:  Jason Scott. is a  55 y.o. male with a diagnosis of Left Shoulder full-thickness rotator Cuff Tear confirmed by MRI.  Given his significant limitation of motion, weakness and pain he elected to proceed with a left shoulder mini open rotator cuff repair.  Patient has undergone a previous successful right mini open rotator cuff repair by me.    The risks benefits and alternatives were discussed with the patient preoperatively including but not limited to the risks of infection, bleeding, nerve injury, persistent pain or weakness, shoulder stiffness/arthrofibrosis, failure of the repair, re-tear of the rotator cuff and the need for further surgery. Medical risks include DVT and pulmonary embolism, myocardial infarction, stroke, pneumonia, respiratory failure and death. Patient understood these risks and wished to proceed.  Patient is aware of the risks and benefits of surgery having been through this procedure performed.  OPERATIVE IMPLANTS: Patrick MultiFix anchors x 3 & [employer] Nephew Q Fix anchors x 2  OPERATIVE FINDINGS: Patient had a full-thickness tear involving the supra and infraspinatus with mild retraction.  Patient had a SLAP tear  with tendinosis of the long head of the biceps tendon.  Patient no focal chondral lesions of the glenohumeral joint.  He had extensive fraying to the labrum without detachment of the anterior, posterior or inferior labrum.  Patient had subacromial spurs with subacromial bursitis and advanced acromioclavicular joint arthrosis.  OPERATIVE PROCEDURE: The patient was met in the preoperative area. The left shoulder was signed with the word yes and my initials according the hospital's correct site of surgery protocol.   A pre-op history and physical was performed at the bedside.   Patient underwent an interscalene block with Exparel by the anesthesia service.  Patient was brought to the operating room where she underwent general endotracheal intubation.  The patient was placed in a beachchair position.  A spider arm positioner was used for this case.  Examination under anesthesia revealed no loss of passive range of motion or instability with load shift testing. The patient had a negative sulcus sign.  Patient was prepped and draped in a sterile fashion. A timeout was performed to verify the patient's name, date of birth, medical record number, correct site of surgery and correct procedure to be performed there was also used to verify the patient received antibiotics that all appropriate instruments, implants and radiographs studies were available in the room. Once all in attendance were in agreement case began.  Patient received ancef 2 grams IV for pre-op antibiotics which he tolerated well despite a penicillin allergy..  Bony landmarks were drawn out with a surgical marker along with proposed arthroscopy incisions.  An 11 blade was used to establish a posterior portal through which the arthroscope was placed in the glenohumeral joint. A full diagnostic examination of the shoulder was performed.  The findings on arthroscopy are noted  above.  Given that the patient had a SLAP tear with extensive tendinosis of the  long head of the biceps tendon, the decision was made to perform a biceps tenodesis.  A perfect pass suture was placed through the biceps tendon and then released from its attachment to the superior labrum with an arthroscopic scissor.  The suture was tagged for later tenodesis during the mini open portion of the case.  Patient had a partial tear involving the subscapularis in line with its fibers.  There was no detachment from the lesser tuberosity.  No repair was required.  The arthroscope was then placed in the subacromial space.  Extensive bursitis was encountered and debrided using a 4-0 resector shaver blade and a 90 ArthroCare wand from a lateral portal which was established under direct visualization using an 18-gauge spinal needle. A subacromial decompression was performed sing a 5.5 mm resector shaver blade from the lateral portal.   A distal clavicle excision was then performed through the anterior portal also using the 5.5 mm resector shaver blade.  A 5.5 mm resector shaver blade was then used to debride the greater tuberosity of all torn fibers of the rotator cuff.   Two Perfect Pass sutures were placed in the lateral border of the rotator cuff tear. All arthroscopic instruments were then removed and the mini-open portion of the procedure began.  A saber-type incision was made along the lateral border of the acromion. The deltoid muscle was identified and split in line with its fibers which allowed visualization of the rotator cuff. The Perfect Pass sutures previously placed in the lateral border of the rotator cuff and biceps tendon were brought out through the deltoid split.    A second perfect pass suture was placed through the base of the biceps tendon.  15 mm of the intra-articular portion of the biceps tendon was resected using a #15 blade.  A tenodesis was then performed with a Smith and Nephew MultiFix anchor at the top of the intertuberous groove.    Two Smith and [employer] Fix anchors  were placed at the articular margin of the humeral head with the greater tuberosity for medial fixation. The suture limbs of both Q Fix anchors were passed medially through the rotator cuff using a First Pass suture passer.    Two additional Perfect Pass sutures were placed in the lateral border of the rotator cuff.  The four total Perfect Pass sutures were then anchored to the greater tuberosity footprint using two [employer] Multifix anchors, placing 2 sutures through each anchor. The sutures passed through the Multifix anchors were then tensioned to allow reduction of the rotator cuff to the greater tuberosity footprint. The medial row repair was then performed using the Q fix sutures, which were tied down using an arthroscopic knot tying technique.  Arthroscopic images of the repair were taken with the arthroscope both externally and from inside the glenohumeral joint.  All incisions were copiously irrigated. The deltoid fascia was repaired using a 0 Vicryl suture.  The subcutaneous tissue of all incisions were closed with a 2-0 Vicryl. Skin closure for the arthroscopic incisions was performed with 4-0 nylon. The skin edges of the saber incision was approximated with a running 4-0 undyed Monocryl.  A dry sterile dressing was applied.  The patient was placed in an abduction sling and a Polar Care was applied to the shoulder.  All sharp, sponge and it instrument counts were correct at the conclusion of the case. I was  scrubbed and present for the entire case. I spoke with the patient's wife postoperatively to let her know the case had been performed without complication and the patient was stable in recovery room.  I reviewed in detail the postop instructions with her and answered all her questions.

## 2020-10-08 NOTE — Anesthesia Procedure Notes (Signed)
Anesthesia Regional Block: Interscalene brachial plexus block   Pre-Anesthetic Checklist: ,, timeout performed, Correct Patient, Correct Site, Correct Laterality, Correct Procedure, Correct Position, site marked, Risks and benefits discussed,  Surgical consent,  Pre-op evaluation,  At surgeon's request and post-op pain management  Laterality: Left  Prep: chloraprep       Needles:  Injection technique: Single-shot  Needle Type: Echogenic Stimulator Needle     Needle Length: 10cm  Needle Gauge: 20     Additional Needles:   Procedures:, nerve stimulator,,, ultrasound used (permanent image in chart),,,,  Narrative:  Start time: 10/08/2020 7:46 AM End time: 10/08/2020 7:49 AM Injection made incrementally with aspirations every 5 mL.  Performed by: Personally   Additional Notes: Functioning IV was confirmed and monitors were applied.  . Sterile prep and drape,hand hygiene and sterile gloves were used.  Negative aspiration and negative test dose prior to incremental administration of local anesthetic. The patient tolerated the procedure well.

## 2020-10-08 NOTE — Anesthesia Procedure Notes (Signed)
Procedure Name: Intubation Date/Time: 10/08/2020 8:08 AM Performed by: Danelle Berry, CRNA Pre-anesthesia Checklist: Patient identified, Emergency Drugs available, Suction available and Patient being monitored Patient Re-evaluated:Patient Re-evaluated prior to induction Oxygen Delivery Method: Circle system utilized Preoxygenation: Pre-oxygenation with 100% oxygen Induction Type: IV induction Ventilation: Mask ventilation without difficulty Laryngoscope Size: McGraph and 3 Grade View: Grade I Tube type: Oral Tube size: 7.5 mm Number of attempts: 1 Airway Equipment and Method: Stylet and Oral airway Placement Confirmation: ETT inserted through vocal cords under direct vision,  positive ETCO2 and breath sounds checked- equal and bilateral Secured at: 22 cm Tube secured with: Tape Dental Injury: Teeth and Oropharynx as per pre-operative assessment

## 2020-10-08 NOTE — Transfer of Care (Signed)
Immediate Anesthesia Transfer of Care Note  Patient: Jason Scott.  Procedure(s) Performed: LEFT SHOULDER ARTHROSCOPY WITH MINI- OPEN ROTATOR CUFF REPAIR AND DISTAL CLAVICLE ACROMINECTOMY (Left Shoulder)  Patient Location: PACU  Anesthesia Type:General  Level of Consciousness: awake  Airway & Oxygen Therapy: Patient Spontanous Breathing and Patient connected to face mask oxygen  Post-op Assessment: Report given to RN and Post -op Vital signs reviewed and stable  Post vital signs: Reviewed and stable  Last Vitals:  Vitals Value Taken Time  BP 141/83 10/08/20 1052  Temp    Pulse 65 10/08/20 1055  Resp 12 10/08/20 1055  SpO2 100 % 10/08/20 1055  Vitals shown include unvalidated device data.  Last Pain:  Vitals:   10/08/20 0611  TempSrc: Temporal  PainSc: 2          Complications: No complications documented.

## 2020-10-08 NOTE — Discharge Instructions (Signed)
Interscalene Nerve Block with Exparel  1.  For your surgery you have received an Interscalene Nerve Block with Exparel. 2. Nerve Blocks affect many types of nerves, including nerves that control movement, pain and normal sensation.  You may experience feelings such as numbness, tingling, heaviness, weakness or the inability to move your arm or the feeling or sensation that your arm has "fallen asleep". 3. A nerve block with Exparel can last up to 5 days.  Usually the weakness wears off first.  The tingling and heaviness usually wear off next.  Finally you may start to notice pain.  Keep in mind that this may occur in any order.  Once a nerve block starts to wear off it is usually completely gone within 60 minutes. 4. ISNB may cause mild shortness of breath, a hoarse voice, blurry vision, unequal pupils, or drooping of the face on the same side as the nerve block.  These symptoms will usually resolve with the numbness.  Very rarely the procedure itself can cause mild seizures. 5. If needed, your surgeon will give you a prescription for pain medication.  It will take about 60 minutes for the oral pain medication to become fully effective.  So, it is recommended that you start taking this medication before the nerve block first begins to wear off, or when you first begin to feel discomfort. 6. Take your pain medication only as prescribed.  Pain medication can cause sedation and decrease your breathing if you take more than you need for the level of pain that you have. 7. Nausea is a common side effect of many pain medications.  You may want to eat something before taking your pain medicine to prevent nausea. 8. After an Interscalene nerve block, you cannot feel pain, pressure or extremes in temperature in the effected arm.  Because your arm is numb it is at an increased risk for injury.  To decrease the possibility of injury, please practice the following:  a. While you are awake change the position of  your arm frequently to prevent too much pressure on any one area for prolonged periods of time. b.  If you have a cast or tight dressing, check the color or your fingers every couple of hours.  Call your surgeon with the appearance of any discoloration (white or blue). c. If you are given a sling to wear before you go home, please wear it  at all times until the block has completely worn off.  Do not get up at night without your sling. d. Please contact [facility] or your surgeon if you do not begin to regain sensation after 7 days from the surgery.  Anesthesia may be contacted by calling the Same Day Surgery Department, Mon. through Fri., 6 am to 4 pm at [phone].   e. If you experience any other problems or concerns, please contact your surgeon's office. f. If you experience severe or prolonged shortness of breath go to the nearest emergency department.   AMBULATORY SURGERY  DISCHARGE INSTRUCTIONS   1) The drugs that you were given will stay in your system until tomorrow so for the next 24 hours you should not:  A) Drive an automobile B) Make any legal decisions C) Drink any alcoholic beverage   2) You may resume regular meals tomorrow.  Today it is better to start with liquids and gradually work up to solid foods.  You may eat anything you prefer, but it is better to start with liquids, then soup   and crackers, and gradually work up to solid foods.   3) Please notify your doctor immediately if you have any unusual bleeding, trouble breathing, redness and pain at the surgery site, drainage, fever, or pain not relieved by medication.    4) Additional Instructions:        Please contact your physician with any problems or Same Day Surgery at [phone], Monday through Friday 6 am to 4 pm, or  at [place] [facility] number at [phone]. 

## 2020-10-08 NOTE — Anesthesia Preprocedure Evaluation (Signed)
Anesthesia Evaluation  Patient identified by MRN, date of birth, ID band Patient awake    Reviewed: Allergy & Precautions, H&P , NPO status , Patient's Chart, lab work & pertinent test results, reviewed documented beta blocker date and time   History of Anesthesia Complications (+) PONV and history of anesthetic complications  Airway Mallampati: II  TM Distance: >3 FB Neck ROM: full    Dental  (+) Teeth Intact   Pulmonary neg pulmonary ROS,    Pulmonary exam normal        Cardiovascular Exercise Tolerance: Good negative cardio ROS Normal cardiovascular exam Rhythm:regular Rate:Normal     Neuro/Psych negative neurological ROS  negative psych ROS   GI/Hepatic negative GI ROS, Neg liver ROS,   Endo/Other  negative endocrine ROS  Renal/GU negative Renal ROS  negative genitourinary   Musculoskeletal   Abdominal   Peds  Hematology negative hematology ROS (+)   Anesthesia Other Findings Past Medical History: No date: Arthritis No date: Complication of anesthesia No date: Hypercholesteremia 2001: PONV (postoperative nausea and vomiting)     Comment:  nausea only Past Surgical History: 2001: CERVICAL FUSION 05/05/2018: SHOULDER ARTHROSCOPY WITH OPEN ROTATOR CUFF REPAIR; Right     Comment:  Procedure: SHOULDER ARTHROSCOPY WITH MINI OPEN ROTATOR               CUFF REPAIR,SUBACROMINAL DECOMPRESSION, DISTAL CLAVICLE               EXCISION, BICEPS TENOTOMY, SUBCAPSULAR REPAIR;  Surgeon:               Juanell Fairly, MD;  Location: [facility];  Service:               Orthopedics;  Laterality: Right; BMI    Body Mass Index: 26.39 kg/m     Reproductive/Obstetrics negative OB ROS                             Anesthesia Physical Anesthesia Plan  ASA: II  Anesthesia Plan: General ETT   Post-op Pain Management:  Regional for Post-op pain   Induction:   PONV Risk Score and Plan:   Airway  Management Planned:   Additional Equipment:   Intra-op Plan:   Post-operative Plan:   Informed Consent: I have reviewed the patients History and Physical, chart, labs and discussed the procedure including the risks, benefits and alternatives for the proposed anesthesia with the patient or authorized representative who has indicated his/her understanding and acceptance.     Dental Advisory Given  Plan Discussed with: CRNA  Anesthesia Plan Comments:         Anesthesia Quick Evaluation

## 2020-10-08 NOTE — H&P (Signed)
PREOPERATIVE H&P  Chief Complaint: Left Shoulder Rotator Cuff Tear  HPI: Jason Scott. is a 55 y.o. male who presents for preoperative history and physical with a diagnosis of Left Shoulder Rotator Cuff Tear confirmed by MRI. Symptoms of pain, weakness and limited range of motion are significantly impairing activities of daily living.  Patient has undergone a previous successful right shoulder rotator cuff repair by me.  Given his significant limitations of his left shoulder he wished to proceed with surgical fixation of his rotator cuff tear.  Past Medical History:  Diagnosis Date  . Arthritis   . Complication of anesthesia   . Hypercholesteremia   . PONV (postoperative nausea and vomiting) 2001   nausea only   Past Surgical History:  Procedure Laterality Date  . CERVICAL FUSION  2001  . SHOULDER ARTHROSCOPY WITH OPEN ROTATOR CUFF REPAIR Right 05/05/2018   Procedure: SHOULDER ARTHROSCOPY WITH MINI OPEN ROTATOR CUFF REPAIR,SUBACROMINAL DECOMPRESSION, DISTAL CLAVICLE EXCISION, BICEPS TENOTOMY, SUBCAPSULAR REPAIR;  Surgeon: Juanell Fairly, MD;  Location: [facility];  Service: Orthopedics;  Laterality: Right;   Social History   Socioeconomic History  . Marital status: Married    Spouse name: Not on file  . Number of children: Not on file  . Years of education: Not on file  . Highest education level: Not on file  Occupational History  . Not on file  Tobacco Use  . Smoking status: Never Smoker  . Smokeless tobacco: Never Used  Vaping Use  . Vaping Use: Never used  Substance and Sexual Activity  . Alcohol use: Yes    Comment: beer occ  . Drug use: Never  . Sexual activity: Not on file  Other Topics Concern  . Not on file  Social History Narrative  . Not on file   Social Determinants of Health   Financial Resource Strain: Not on file  Food Insecurity: Not on file  Transportation Needs: Not on file  Physical Activity: Not on file  Stress: Not on file  Social  Connections: Not on file   Family History  Problem Relation Age of Onset  . Clotting disorder Father   . Clotting disorder Paternal Aunt   . Clotting disorder Paternal Uncle    Allergies  Allergen Reactions  . Penicillins Other (See Comments)    Has patient had a PCN reaction causing immediate rash, facial/tongue/throat swelling, SOB or lightheadedness with hypotension: Unknown Has patient had a PCN reaction causing severe rash involving mucus membranes or skin necrosis: Unknown Has patient had a PCN reaction that required hospitalization: No Has patient had a PCN reaction occurring within the last 10 years: No Childhood reaction If all of the above answers are "NO", then may proceed with Cephalosporin use.    Prior to Admission medications   Medication Sig Start Date End Date Taking? Authorizing Provider  aspirin EC 81 MG tablet Take 81 mg by mouth daily.   Yes [provider]  ondansetron (ZOFRAN) 4 MG tablet Take 1 tablet (4 mg total) by mouth every 8 (eight) hours as needed for nausea or vomiting. Patient not taking: No sig reported 05/05/18   Juanell Fairly, MD  oxyCODONE (OXY IR/ROXICODONE) 5 MG immediate release tablet Take 1 tablet (5 mg total) by mouth every 4 (four) hours as needed. Patient not taking: No sig reported 05/05/18   Juanell Fairly, MD     Positive ROS: All other systems have been reviewed and were otherwise negative with the exception of those mentioned in the HPI  and as above.  Physical Exam: General: Alert, no acute distress Cardiovascular: Regular rate and rhythm, no murmurs rubs or gallops.  No pedal edema Respiratory: Clear to auscultation bilaterally, no wheezes rales or rhonchi. No cyanosis, no use of accessory musculature GI: No organomegaly, abdomen is soft and non-tender nondistended with positive bowel sounds. Skin: Skin intact, no lesions within the operative field. Neurologic: Sensation intact distally Psychiatric: Patient is  competent for consent with normal mood and affect Lymphatic: No cervical lymphadenopathy  MUSCULOSKELETAL: Left shoulder: Patient has intact skin without erythema ecchymosis, swelling, deformity or muscle atrophy.  He can forward elevate and abduct to approximately 70 to 80 degrees and demonstrates weakness of shoulder abduction and external rotation.  Positive impingement signs but no apprehension instability.  He has full digital wrist and elbow range of motion, intact sensation light touch and a palpable radial pulse.  Assessment: Left Shoulder Rotator Cuff Tear  Plan: Plan for Procedure(s): LEFT SHOULDER ARTHROSCOPY WITH MINI- OPEN ROTATOR CUFF REPAIR AND DISTAL CLAVICLE ACROMINECTOMY  I reviewed the details of the operation as well as the postoperative course.  Patient is familiar with the surgery having been through before.  A preop history and physical was performed at the bedside this morning.  His left shoulder was marked according the hospital's correct site of surgery protocol.  I discussed the risks and benefits of surgery. The risks include but are not limited to infection, bleeding, nerve or blood vessel injury, joint stiffness or loss of motion, persistent pain, weakness or instability, retear of the rotator cuff, failure of the repair, hardware failure and the need for further surgery. Medical risks include but are not limited to DVT and pulmonary embolism, myocardial infarction, stroke, pneumonia, respiratory failure and death. Patient understood these risks and wished to proceed.     Juanell Fairly, MD   10/08/2020 7:45 AM

## 2020-10-09 ENCOUNTER — Encounter: Payer: Self-pay | Admitting: Orthopedic Surgery

## 2020-10-09 NOTE — Anesthesia Postprocedure Evaluation (Signed)
Anesthesia Post Note  Patient: Jason Scott.  Procedure(s) Performed: LEFT SHOULDER ARTHROSCOPY WITH MINI- OPEN ROTATOR CUFF REPAIR AND DISTAL CLAVICLE ACROMINECTOMY (Left Shoulder)  Patient location during evaluation: PACU Anesthesia Type: General Level of consciousness: awake and alert Pain management: pain level controlled Vital Signs Assessment: post-procedure vital signs reviewed and stable Respiratory status: spontaneous breathing, nonlabored ventilation, respiratory function stable and patient connected to nasal cannula oxygen Cardiovascular status: blood pressure returned to baseline and stable Postop Assessment: no apparent nausea or vomiting Anesthetic complications: no   No complications documented.   Last Vitals:  Vitals:   10/08/20 1145 10/08/20 1214  BP: (!) 144/91 (!) 147/82  Pulse: 70 77  Resp: 11 16  Temp: (!) 36.1 C (!) 36.3 C  SpO2: 98% 96%    Last Pain:  Vitals:   10/09/20 1006  TempSrc:   PainSc: 0-No pain                 Yevette Edwards

## 2021-07-18 ENCOUNTER — Other Ambulatory Visit
Admission: RE | Admit: 2021-07-18 | Discharge: 2021-07-18 | Disposition: A | Discharge status: Home / Self Care | Payer: 59 | Source: Ambulatory Visit | Attending: Rheumatology | Admitting: Rheumatology

## 2021-07-18 DIAGNOSIS — M79606 Pain in leg, unspecified: Secondary | ICD-10-CM | POA: Insufficient documentation

## 2021-07-18 DIAGNOSIS — M2550 Pain in unspecified joint: Secondary | ICD-10-CM | POA: Insufficient documentation

## 2021-07-18 DIAGNOSIS — M7989 Other specified soft tissue disorders: Secondary | ICD-10-CM | POA: Insufficient documentation

## 2021-07-18 LAB — BRAIN NATRIURETIC PEPTIDE: B Natriuretic Peptide: 52.4 pg/mL (ref 0.0–100.0)

## 2022-10-23 DIAGNOSIS — Z79899 Other long term (current) drug therapy: Secondary | ICD-10-CM | POA: Diagnosis not present

## 2022-10-23 DIAGNOSIS — M0609 Rheumatoid arthritis without rheumatoid factor, multiple sites: Secondary | ICD-10-CM | POA: Diagnosis not present

## 2022-12-18 DIAGNOSIS — Z0000 Encounter for general adult medical examination without abnormal findings: Secondary | ICD-10-CM | POA: Diagnosis not present

## 2022-12-18 DIAGNOSIS — E7800 Pure hypercholesterolemia, unspecified: Secondary | ICD-10-CM | POA: Diagnosis not present

## 2022-12-18 DIAGNOSIS — M0600 Rheumatoid arthritis without rheumatoid factor, unspecified site: Secondary | ICD-10-CM | POA: Diagnosis not present

## 2022-12-18 DIAGNOSIS — Z1331 Encounter for screening for depression: Secondary | ICD-10-CM | POA: Diagnosis not present

## 2022-12-18 DIAGNOSIS — M7711 Lateral epicondylitis, right elbow: Secondary | ICD-10-CM | POA: Diagnosis not present

## 2023-02-17 DIAGNOSIS — Z1211 Encounter for screening for malignant neoplasm of colon: Secondary | ICD-10-CM | POA: Diagnosis not present

## 2023-02-17 DIAGNOSIS — Z1212 Encounter for screening for malignant neoplasm of rectum: Secondary | ICD-10-CM | POA: Diagnosis not present

## 2023-03-12 DIAGNOSIS — Z79899 Other long term (current) drug therapy: Secondary | ICD-10-CM | POA: Diagnosis not present

## 2023-03-12 DIAGNOSIS — M0609 Rheumatoid arthritis without rheumatoid factor, multiple sites: Secondary | ICD-10-CM | POA: Diagnosis not present

## 2023-07-16 DIAGNOSIS — M0609 Rheumatoid arthritis without rheumatoid factor, multiple sites: Secondary | ICD-10-CM | POA: Diagnosis not present

## 2023-07-16 DIAGNOSIS — Z79899 Other long term (current) drug therapy: Secondary | ICD-10-CM | POA: Diagnosis not present

## 2023-07-23 DIAGNOSIS — L209 Atopic dermatitis, unspecified: Secondary | ICD-10-CM | POA: Diagnosis not present

## 2023-07-23 DIAGNOSIS — L578 Other skin changes due to chronic exposure to nonionizing radiation: Secondary | ICD-10-CM | POA: Diagnosis not present

## 2023-07-23 DIAGNOSIS — L299 Pruritus, unspecified: Secondary | ICD-10-CM | POA: Diagnosis not present

## 2023-07-23 DIAGNOSIS — L814 Other melanin hyperpigmentation: Secondary | ICD-10-CM | POA: Diagnosis not present

## 2023-07-23 DIAGNOSIS — L570 Actinic keratosis: Secondary | ICD-10-CM | POA: Diagnosis not present

## 2023-10-10 DIAGNOSIS — R051 Acute cough: Secondary | ICD-10-CM | POA: Diagnosis not present

## 2023-10-10 DIAGNOSIS — R0981 Nasal congestion: Secondary | ICD-10-CM | POA: Diagnosis not present

## 2023-10-10 DIAGNOSIS — R0982 Postnasal drip: Secondary | ICD-10-CM | POA: Diagnosis not present

## 2023-11-19 DIAGNOSIS — M0609 Rheumatoid arthritis without rheumatoid factor, multiple sites: Secondary | ICD-10-CM | POA: Diagnosis not present

## 2023-11-19 DIAGNOSIS — Z79899 Other long term (current) drug therapy: Secondary | ICD-10-CM | POA: Diagnosis not present

## 2023-12-24 DIAGNOSIS — E7800 Pure hypercholesterolemia, unspecified: Secondary | ICD-10-CM | POA: Diagnosis not present

## 2023-12-24 DIAGNOSIS — Z0000 Encounter for general adult medical examination without abnormal findings: Secondary | ICD-10-CM | POA: Diagnosis not present

## 2023-12-24 DIAGNOSIS — M0600 Rheumatoid arthritis without rheumatoid factor, unspecified site: Secondary | ICD-10-CM | POA: Diagnosis not present

## 2023-12-24 DIAGNOSIS — Z1331 Encounter for screening for depression: Secondary | ICD-10-CM | POA: Diagnosis not present

## 2023-12-24 DIAGNOSIS — S76302A Unspecified injury of muscle, fascia and tendon of the posterior muscle group at thigh level, left thigh, initial encounter: Secondary | ICD-10-CM | POA: Diagnosis not present

## 2024-03-17 DIAGNOSIS — Z79899 Other long term (current) drug therapy: Secondary | ICD-10-CM | POA: Diagnosis not present

## 2024-03-17 DIAGNOSIS — M0609 Rheumatoid arthritis without rheumatoid factor, multiple sites: Secondary | ICD-10-CM | POA: Diagnosis not present
